# Patient Record
Sex: Male | Born: 1945 | Race: White | Hispanic: No | Marital: Married | Smoking: Former smoker
Health system: Southern US, Community
[De-identification: ages and names within clinical notes are randomized; demographics above are authoritative.]

## PROBLEM LIST (undated history)

## (undated) DIAGNOSIS — E7800 Pure hypercholesterolemia, unspecified: Secondary | ICD-10-CM

## (undated) DIAGNOSIS — C801 Malignant (primary) neoplasm, unspecified: Secondary | ICD-10-CM

## (undated) DIAGNOSIS — E039 Hypothyroidism, unspecified: Secondary | ICD-10-CM

## (undated) DIAGNOSIS — I219 Acute myocardial infarction, unspecified: Secondary | ICD-10-CM

## (undated) DIAGNOSIS — I10 Essential (primary) hypertension: Secondary | ICD-10-CM

## (undated) DIAGNOSIS — E079 Disorder of thyroid, unspecified: Secondary | ICD-10-CM

## (undated) HISTORY — PX: OTHER SURGICAL HISTORY: SHX169

## (undated) HISTORY — PX: COLONOSCOPY: SHX5424

## (undated) HISTORY — PX: CORONARY ANGIOPLASTY: SHX604

## (undated) HISTORY — PX: CATARACT EXTRACTION, BILATERAL: SHX1313

---

## 2001-04-25 ENCOUNTER — Encounter: Payer: Self-pay | Admitting: Internal Medicine

## 2001-04-25 ENCOUNTER — Ambulatory Visit (HOSPITAL_COMMUNITY): Admission: RE | Admit: 2001-04-25 | Discharge: 2001-04-25 | Payer: Self-pay | Admitting: Internal Medicine

## 2002-05-25 ENCOUNTER — Encounter: Payer: Self-pay | Admitting: Family Medicine

## 2002-05-25 ENCOUNTER — Ambulatory Visit (HOSPITAL_COMMUNITY): Admission: RE | Admit: 2002-05-25 | Discharge: 2002-05-25 | Payer: Self-pay | Admitting: Family Medicine

## 2003-03-29 ENCOUNTER — Ambulatory Visit (HOSPITAL_COMMUNITY): Admission: RE | Admit: 2003-03-29 | Discharge: 2003-03-29 | Payer: Self-pay | Admitting: Internal Medicine

## 2004-03-03 ENCOUNTER — Ambulatory Visit: Payer: Self-pay | Admitting: Cardiology

## 2004-03-06 ENCOUNTER — Encounter: Payer: Self-pay | Admitting: Emergency Medicine

## 2004-03-06 ENCOUNTER — Ambulatory Visit: Payer: Self-pay | Admitting: *Deleted

## 2004-03-06 ENCOUNTER — Inpatient Hospital Stay (HOSPITAL_COMMUNITY): Admission: EM | Admit: 2004-03-06 | Discharge: 2004-03-09 | Payer: Self-pay | Admitting: *Deleted

## 2004-03-23 ENCOUNTER — Ambulatory Visit: Payer: Self-pay

## 2004-03-27 ENCOUNTER — Ambulatory Visit: Payer: Self-pay | Admitting: Cardiology

## 2004-03-27 ENCOUNTER — Observation Stay (HOSPITAL_COMMUNITY): Admission: AD | Admit: 2004-03-27 | Discharge: 2004-03-28 | Payer: Self-pay | Admitting: Family Medicine

## 2004-03-27 ENCOUNTER — Ambulatory Visit (HOSPITAL_COMMUNITY): Admission: RE | Admit: 2004-03-27 | Discharge: 2004-03-27 | Payer: Self-pay | Admitting: Family Medicine

## 2004-06-28 ENCOUNTER — Ambulatory Visit: Payer: Self-pay | Admitting: *Deleted

## 2004-07-06 ENCOUNTER — Ambulatory Visit: Payer: Self-pay

## 2004-07-07 ENCOUNTER — Ambulatory Visit: Payer: Self-pay | Admitting: *Deleted

## 2004-07-07 ENCOUNTER — Ambulatory Visit (HOSPITAL_COMMUNITY): Admission: RE | Admit: 2004-07-07 | Discharge: 2004-07-07 | Payer: Self-pay | Admitting: *Deleted

## 2004-07-18 ENCOUNTER — Ambulatory Visit: Payer: Self-pay | Admitting: *Deleted

## 2004-07-19 ENCOUNTER — Ambulatory Visit: Payer: Self-pay | Admitting: Internal Medicine

## 2004-08-11 ENCOUNTER — Ambulatory Visit: Payer: Self-pay

## 2004-08-14 ENCOUNTER — Ambulatory Visit: Payer: Self-pay | Admitting: Internal Medicine

## 2004-08-16 ENCOUNTER — Inpatient Hospital Stay (HOSPITAL_COMMUNITY): Admission: RE | Admit: 2004-08-16 | Discharge: 2004-08-18 | Payer: Self-pay | Admitting: Internal Medicine

## 2004-08-17 ENCOUNTER — Ambulatory Visit: Payer: Self-pay | Admitting: *Deleted

## 2004-09-01 ENCOUNTER — Ambulatory Visit: Payer: Self-pay | Admitting: Internal Medicine

## 2004-09-11 ENCOUNTER — Ambulatory Visit: Payer: Self-pay | Admitting: Internal Medicine

## 2004-10-06 ENCOUNTER — Ambulatory Visit: Payer: Self-pay | Admitting: Cardiology

## 2004-10-06 ENCOUNTER — Encounter: Payer: Self-pay | Admitting: Emergency Medicine

## 2004-10-07 ENCOUNTER — Observation Stay (HOSPITAL_COMMUNITY): Admission: AD | Admit: 2004-10-07 | Discharge: 2004-10-08 | Payer: Self-pay | Admitting: Cardiology

## 2005-03-20 ENCOUNTER — Other Ambulatory Visit: Admission: RE | Admit: 2005-03-20 | Discharge: 2005-03-20 | Payer: Self-pay | Admitting: Dermatology

## 2012-02-18 ENCOUNTER — Encounter (HOSPITAL_COMMUNITY): Payer: Self-pay

## 2012-02-18 ENCOUNTER — Inpatient Hospital Stay (HOSPITAL_COMMUNITY)
Admission: EM | Admit: 2012-02-18 | Discharge: 2012-02-21 | DRG: 446 | Disposition: A | Discharge status: Other Acute Inpatient Hospital | Payer: Medicare HMO | Attending: Internal Medicine | Admitting: Internal Medicine

## 2012-02-18 ENCOUNTER — Ambulatory Visit (HOSPITAL_COMMUNITY)
Admission: RE | Admit: 2012-02-18 | Discharge: 2012-02-18 | Disposition: A | Discharge status: Home / Self Care | Payer: Medicare HMO | Source: Ambulatory Visit | Attending: Family Medicine | Admitting: Family Medicine

## 2012-02-18 ENCOUNTER — Other Ambulatory Visit (HOSPITAL_COMMUNITY): Payer: Self-pay | Admitting: Family Medicine

## 2012-02-18 ENCOUNTER — Emergency Department (HOSPITAL_COMMUNITY): Payer: Medicare HMO

## 2012-02-18 ENCOUNTER — Other Ambulatory Visit: Payer: Self-pay

## 2012-02-18 DIAGNOSIS — R932 Abnormal findings on diagnostic imaging of liver and biliary tract: Secondary | ICD-10-CM | POA: Insufficient documentation

## 2012-02-18 DIAGNOSIS — E039 Hypothyroidism, unspecified: Secondary | ICD-10-CM | POA: Diagnosis present

## 2012-02-18 DIAGNOSIS — K8051 Calculus of bile duct without cholangitis or cholecystitis with obstruction: Principal | ICD-10-CM | POA: Diagnosis present

## 2012-02-18 DIAGNOSIS — Z79899 Other long term (current) drug therapy: Secondary | ICD-10-CM

## 2012-02-18 DIAGNOSIS — Z87891 Personal history of nicotine dependence: Secondary | ICD-10-CM

## 2012-02-18 DIAGNOSIS — I4510 Unspecified right bundle-branch block: Secondary | ICD-10-CM | POA: Diagnosis present

## 2012-02-18 DIAGNOSIS — R109 Unspecified abdominal pain: Secondary | ICD-10-CM | POA: Insufficient documentation

## 2012-02-18 DIAGNOSIS — R17 Unspecified jaundice: Secondary | ICD-10-CM | POA: Insufficient documentation

## 2012-02-18 DIAGNOSIS — Z7982 Long term (current) use of aspirin: Secondary | ICD-10-CM

## 2012-02-18 DIAGNOSIS — K8020 Calculus of gallbladder without cholecystitis without obstruction: Secondary | ICD-10-CM | POA: Diagnosis present

## 2012-02-18 DIAGNOSIS — K831 Obstruction of bile duct: Secondary | ICD-10-CM | POA: Diagnosis present

## 2012-02-18 DIAGNOSIS — K8050 Calculus of bile duct without cholangitis or cholecystitis without obstruction: Secondary | ICD-10-CM

## 2012-02-18 DIAGNOSIS — E785 Hyperlipidemia, unspecified: Secondary | ICD-10-CM | POA: Diagnosis present

## 2012-02-18 DIAGNOSIS — E7800 Pure hypercholesterolemia, unspecified: Secondary | ICD-10-CM | POA: Diagnosis present

## 2012-02-18 DIAGNOSIS — Z8249 Family history of ischemic heart disease and other diseases of the circulatory system: Secondary | ICD-10-CM

## 2012-02-18 DIAGNOSIS — Z9861 Coronary angioplasty status: Secondary | ICD-10-CM

## 2012-02-18 DIAGNOSIS — I2510 Atherosclerotic heart disease of native coronary artery without angina pectoris: Secondary | ICD-10-CM

## 2012-02-18 DIAGNOSIS — I10 Essential (primary) hypertension: Secondary | ICD-10-CM | POA: Diagnosis present

## 2012-02-18 HISTORY — DX: Pure hypercholesterolemia, unspecified: E78.00

## 2012-02-18 HISTORY — DX: Disorder of thyroid, unspecified: E07.9

## 2012-02-18 HISTORY — DX: Essential (primary) hypertension: I10

## 2012-02-18 LAB — CBC WITH DIFFERENTIAL/PLATELET
Eosinophils Absolute: 0.1 10*3/uL (ref 0.0–0.7)
Eosinophils Relative: 2 % (ref 0–5)
Hemoglobin: 13.7 g/dL (ref 13.0–17.0)
Lymphs Abs: 1.7 10*3/uL (ref 0.7–4.0)
MCH: 29.6 pg (ref 26.0–34.0)
MCV: 87.3 fL (ref 78.0–100.0)
Monocytes Absolute: 0.9 10*3/uL (ref 0.1–1.0)
Monocytes Relative: 11 % (ref 3–12)
RBC: 4.63 MIL/uL (ref 4.22–5.81)

## 2012-02-18 LAB — LIPASE, BLOOD: Lipase: 14 U/L (ref 11–59)

## 2012-02-18 LAB — COMPREHENSIVE METABOLIC PANEL
Alkaline Phosphatase: 185 U/L — ABNORMAL HIGH (ref 39–117)
BUN: 13 mg/dL (ref 6–23)
Calcium: 9.3 mg/dL (ref 8.4–10.5)
GFR calc Af Amer: 89 mL/min — ABNORMAL LOW (ref >90)
Glucose, Bld: 111 mg/dL — ABNORMAL HIGH (ref 70–99)
Total Protein: 7.5 g/dL (ref 6.0–8.3)

## 2012-02-18 MED ORDER — SODIUM CHLORIDE 0.9 % IV SOLN: INTRAVENOUS | Status: DC

## 2012-02-18 MED ORDER — TRAZODONE HCL 50 MG PO TABS
100.0000 mg | ORAL_TABLET | Freq: Every day | ORAL | Status: DC
  Administered 2012-02-18 – 2012-02-20 (×3): 100 mg via ORAL
  Filled 2012-02-18 (×3): qty 2

## 2012-02-18 MED ORDER — ACETAMINOPHEN 650 MG RE SUPP: 650.0000 mg | Freq: Four times a day (QID) | RECTAL | Status: DC | PRN

## 2012-02-18 MED ORDER — PIPERACILLIN-TAZOBACTAM 3.375 G IVPB
INTRAVENOUS | Status: AC
  Filled 2012-02-18: qty 100

## 2012-02-18 MED ORDER — ONDANSETRON HCL 4 MG PO TABS: 4.0000 mg | ORAL_TABLET | Freq: Four times a day (QID) | ORAL | Status: DC | PRN

## 2012-02-18 MED ORDER — PIPERACILLIN-TAZOBACTAM 3.375 G IVPB
3.3750 g | Freq: Three times a day (TID) | INTRAVENOUS | Status: DC
  Administered 2012-02-18 – 2012-02-20 (×6): 3.375 g via INTRAVENOUS
  Filled 2012-02-18 (×9): qty 50

## 2012-02-18 MED ORDER — VERAPAMIL HCL ER 240 MG PO TBCR
240.0000 mg | EXTENDED_RELEASE_TABLET | Freq: Every morning | ORAL | Status: DC
  Administered 2012-02-20: 240 mg via ORAL
  Filled 2012-02-18: qty 1

## 2012-02-18 MED ORDER — ACETAMINOPHEN 325 MG PO TABS: 650.0000 mg | ORAL_TABLET | Freq: Four times a day (QID) | ORAL | Status: DC | PRN

## 2012-02-18 MED ORDER — SODIUM CHLORIDE 0.9 % IV SOLN
INTRAVENOUS | Status: DC
  Administered 2012-02-18 – 2012-02-19 (×2): via INTRAVENOUS

## 2012-02-18 MED ORDER — LEVOTHYROXINE SODIUM 75 MCG PO TABS
175.0000 ug | ORAL_TABLET | Freq: Every day | ORAL | Status: DC
  Administered 2012-02-19 – 2012-02-20 (×2): 175 ug via ORAL
  Filled 2012-02-18 (×2): qty 1

## 2012-02-18 MED ORDER — LISINOPRIL 10 MG PO TABS
20.0000 mg | ORAL_TABLET | Freq: Every day | ORAL | Status: DC
  Administered 2012-02-18 – 2012-02-20 (×3): 20 mg via ORAL
  Filled 2012-02-18 (×3): qty 2

## 2012-02-18 MED ORDER — MORPHINE SULFATE 2 MG/ML IJ SOLN
2.0000 mg | INTRAMUSCULAR | Status: DC | PRN
  Administered 2012-02-19 – 2012-02-20 (×3): 2 mg via INTRAVENOUS
  Filled 2012-02-18 (×3): qty 1

## 2012-02-18 MED ORDER — SODIUM CHLORIDE 0.9 % IJ SOLN
3.0000 mL | Freq: Two times a day (BID) | INTRAMUSCULAR | Status: DC
  Administered 2012-02-20: 3 mL via INTRAVENOUS
  Filled 2012-02-18: qty 3

## 2012-02-18 MED ORDER — ONDANSETRON HCL 4 MG/2ML IJ SOLN
4.0000 mg | Freq: Four times a day (QID) | INTRAMUSCULAR | Status: DC | PRN
  Administered 2012-02-19: 4 mg via INTRAVENOUS
  Filled 2012-02-18: qty 2

## 2012-02-18 NOTE — ED Notes (Signed)
Dr.mcmanus to see pt 

## 2012-02-18 NOTE — ED Notes (Signed)
Pt has been sick for 2 weeks w/ ab pain, had ultrasound today and told by pmd to come to er for eval. +nausea and diarrhea.

## 2012-02-18 NOTE — ED Notes (Signed)
Family at bedside. Patient was informed an UA is needed and patient states he can not give one at this time. Patient would like something to drink. RN made aware.

## 2012-02-18 NOTE — ED Provider Notes (Signed)
History     CSN: [id number]  Arrival date & time 02/18/12  1727   First MD Initiated Contact with Patient 02/18/12 1803      Chief Complaint  Patient presents with  . Abdominal Pain    HPI Pt was seen at 1810.  Per pt, c/o gradual onset and persistence of constant upper abd "pain" for the past 2 weeks.  Has been associated with multiple intermittent episodes of N/V/D as well as jaundice.  Pt states he was eval by his PMD for same, had labs completed last weekend and had [place] abd done today.  Pt was then called and told to come to the ED for further eval and admission.  Denies black or blood in stools or emesis, no back pain, no fevers, no CP/SOB.    Past Medical History  Diagnosis Date  . Hypertension   . Thyroid disease   . Hypercholesteremia     Past Surgical History  Procedure Date  . Cardiac stents     History  Substance Use Topics  . Smoking status: Never Smoker   . Smokeless tobacco: Not on file  . Alcohol Use: No      Review of Systems ROS: Statement: All systems negative except as marked or noted in the HPI; Constitutional: Negative for fever and chills. ; ; Eyes: Negative for eye pain, redness and discharge. ; ; ENMT: Negative for ear pain, hoarseness, nasal congestion, sinus pressure and sore throat. ; ; Cardiovascular: Negative for chest pain, palpitations, diaphoresis, dyspnea and peripheral edema. ; ; Respiratory: Negative for cough, wheezing and stridor. ; ; Gastrointestinal: +abd pain, N/V/D. Negative for blood in stool, hematemesis, jaundice and rectal bleeding. . ; ; Genitourinary: Negative for dysuria, flank pain and hematuria. ; ; Musculoskeletal: Negative for back pain and neck pain. Negative for swelling and trauma.; ; Skin: Negative for pruritus, rash, abrasions, blisters, bruising and skin lesion.; ; Neuro: Negative for headache, lightheadedness and neck stiffness. Negative for weakness, altered level of consciousness , altered mental status, extremity  weakness, paresthesias, involuntary movement, seizure and syncope.       Allergies  Review of patient's allergies indicates no known allergies.  Home Medications   Current Outpatient Rx  Name Route Sig Dispense Refill  . ASPIRIN EC 81 MG PO TBEC Oral Take 81 mg by mouth every evening.    Marland Kitchen OMEGA-3 FATTY ACIDS 1000 MG PO CAPS Oral Take 2 g by mouth 2 (two) times daily.    Marland Kitchen LEVOTHYROXINE SODIUM 175 MCG PO TABS Oral Take 175 mcg by mouth every morning.    Marland Kitchen LISINOPRIL 20 MG PO TABS Oral Take 20 mg by mouth at bedtime.    . TRAZODONE HCL 100 MG PO TABS Oral Take 100 mg by mouth at bedtime.    Marland Kitchen VERAPAMIL HCL ER 240 MG PO TBCR Oral Take 240 mg by mouth every morning.      BP 128/62  Pulse 65  Temp 97.7 F (36.5 C) (Oral)  Resp 18  Ht 6\' 2"  (1.88 m)  Wt 240 lb (108.863 kg)  BMI 30.81 kg/m2  SpO2 94%  Physical Exam 1815:  Physical examination:  Nursing notes reviewed; Vital signs and O2 SAT reviewed;  Constitutional: Well developed, Well nourished, Well hydrated, In no acute distress; Head:  Normocephalic, atraumatic; Eyes: EOMI, PERRL, +scleral icterus; ENMT: Mouth and pharynx normal, Mucous membranes moist; Neck: Supple, Full range of motion, No lymphadenopathy; Cardiovascular: Regular rate and rhythm, No gallop; Respiratory: Breath sounds clear &  equal bilaterally, No wheezes.  Speaking full sentences with ease, Normal respiratory effort/excursion; Chest: Nontender, Movement normal; Abdomen: Soft, +RUQ tender to palp. No rebound or guarding, Nondistended, Normal bowel sounds; Genitourinary: No CVA tenderness; Extremities: Pulses normal, No tenderness, No edema, No calf edema or asymmetry.; Neuro: AA&Ox3, Major CN grossly intact.  Speech clear. No gross focal motor or sensory deficits in extremities.; Skin: Color jaundiced, Warm, Dry.   ED Course  Procedures    MDM  MDM Reviewed: nursing note, vitals and previous chart Reviewed previous: ultrasound and ECG Interpretation: labs,  x-ray and ECG    Date: 02/18/2012  Rate: 68  Rhythm: normal sinus rhythm  QRS Axis: normal  Intervals: normal  ST/T Wave abnormalities: normal  Conduction Disutrbances:right bundle branch block  Narrative Interpretation:   Old EKG Reviewed: unchanged; no significant changes from previous EKG dated 10/07/2004.  Results for orders placed during the hospital encounter of 02/18/12  CBC WITH DIFFERENTIAL      Component Value Range   WBC 8.2  4.0 - 10.5 K/uL   RBC 4.63  4.22 - 5.81 MIL/uL   Hemoglobin 13.7  13.0 - 17.0 g/dL   HCT 56.2  13.0 - 86.5 %   MCV 87.3  78.0 - 100.0 fL   MCH 29.6  26.0 - 34.0 pg   MCHC 33.9  30.0 - 36.0 g/dL   RDW 78.4 (*) 69.6 - 29.5 %   Platelets 241  150 - 400 K/uL   Neutrophils Relative 65  43 - 77 %   Neutro Abs 5.3  1.7 - 7.7 K/uL   Lymphocytes Relative 21  12 - 46 %   Lymphs Abs 1.7  0.7 - 4.0 K/uL   Monocytes Relative 11  3 - 12 %   Monocytes Absolute 0.9  0.1 - 1.0 K/uL   Eosinophils Relative 2  0 - 5 %   Eosinophils Absolute 0.1  0.0 - 0.7 K/uL   Basophils Relative 1  0 - 1 %   Basophils Absolute 0.1  0.0 - 0.1 K/uL  COMPREHENSIVE METABOLIC PANEL      Component Value Range   Sodium 132 (*) 135 - 145 mEq/L   Potassium 3.5  3.5 - 5.1 mEq/L   Chloride 99  96 - 112 mEq/L   CO2 23  19 - 32 mEq/L   Glucose, Bld 111 (*) 70 - 99 mg/dL   BUN 13  6 - 23 mg/dL   Creatinine, Ser 2.84  0.50 - 1.35 mg/dL   Calcium 9.3  8.4 - 13.2 mg/dL   Total Protein 7.5  6.0 - 8.3 g/dL   Albumin 3.4 (*) 3.5 - 5.2 g/dL   AST 67 (*) 0 - 37 U/L   ALT 152 (*) 0 - 53 U/L   Alkaline Phosphatase 185 (*) 39 - 117 U/L   Total Bilirubin 9.2 (*) 0.3 - 1.2 mg/dL   GFR calc non Af Amer 76 (*) >90 mL/min   GFR calc Af Amer 89 (*) >90 mL/min  LIPASE, BLOOD      Component Value Range   Lipase 14  11 - 59 U/L  TROPONIN I      Component Value Range   Troponin I <0.30  <0.30 ng/mL   US Abdomen Complete 02/18/2012  *RADIOLOGY REPORT*  Clinical Data:  Jaundice, abdominal pain.   COMPLETE ABDOMINAL ULTRASOUND  Comparison:  03/28/2004  Findings:  Gallbladder:  Gallbladder is filled with gallstones.  The gallbladder wall is difficult to visualize due to  the extensive gallstones.  Common bile duct:   Common bile duct is dilated, measuring 10 mm. Echogenic area within the common bile duct concerning for CBD stone.  Liver:  Intrahepatic biliary ductal dilatation.  Normal echotexture.  No focal abnormality.  IVC:  Appears normal.  Pancreas:  Borderline pancreatic duct at 4 mm.  No focal parenchymal abnormality.  Spleen:  Within normal limits in size and echotexture.  Right Kidney:   Normal in size and parenchymal echogenicity.  No evidence of mass or hydronephrosis.  Left Kidney:  Normal in size and parenchymal echogenicity.  No evidence of mass or hydronephrosis.  Abdominal aorta:  No aneurysm identified.  IMPRESSION: The gallbladder is filled with gallstones.  Findings concerning for CBD stone with extrahepatic and intrahepatic biliary ductal dilatation.   Original Report Authenticated By: Cyndie Chime, M.D.      1945:  LFT's elevated.  [place] concerning for CBD stone and cholelithiasis, but no signs of acute cholecystitis.  Remains afebrile with normal VS. Dx and testing d/w pt and family.  Questions answered.  Verb understanding, agreeable to admit.  T/C to General Surgery Dr. Lovell Sheehan, case discussed, including:  HPI, pertinent PM/SHx, VS/PE, dx testing, ED course and treatment:  Agreeable to consult, requests to admit to medicine service; pt will need GI consult.  1950:  T/C to [facility] Dr. Phillips Odor, case discussed, including:  HPI, pertinent PM/SHx, VS/PE, dx testing, ED course and treatment:  Agreeable to admit.         Laray Anger, DO 02/20/12 1557

## 2012-02-18 NOTE — H&P (Addendum)
[facility] History and Physical  KONG PACKETT ZOX:[id number] DOB: Dec 16, 1945 DOA: 02/18/2012  Referring physician: EDP McMannus PCP: Kirk Ruths, MD  Specialists: Zenon Mayo, General Surgery and N. Rehman, GI  Chief Complaint: Abdominal Pain, Fever, Jaundice  HPI: Lucas Patton is a 66 y.o. male with a PMH significant for hypertension, CADs/p 2 vessel PCI, and Hyperlipidemia who presents to [facility] after a one week history of progressive abdominal pain, subjective fever and chills, and jaundice. He was seen by his PCP today who sent him for abdominal ultrasound which showed CBD stone and cholelithiasis with intra and extra hepatic ductal dilation suspicious for obstruction. He was sent to ED for admission and surgical evaluation for complicated gallstone disease and work up for CBD obstruction. Patient has been having abdominal pain intermittently for months and has largely ignored his symptoms. He was self treating by eating low fat food which reportedly did not  trigger his pain. Has had 15 lbs weight loss per wife. Today he went to see his PCP when he was noticeably jaundice, weaker, had fevers and increasing abdominal pain. He also reports orange urine for past week and light yellow mucousy stools.   Review of Systems:  Review of Systems  Constitutional: Positive for fever, chills, weight loss and malaise/fatigue.  HENT: Negative.   Eyes: Negative.   Respiratory: Negative.   Cardiovascular: Negative.   Gastrointestinal: Positive for nausea and abdominal pain.  Genitourinary: Negative.   Musculoskeletal: Negative.   Skin: Positive for itching. Negative for rash.  Neurological: Positive for weakness.  Endo/Heme/Allergies: Negative.   Psychiatric/Behavioral: Negative.   All other systems reviewed and are negative.     Past Medical History  Diagnosis Date  . Hypertension   . Thyroid disease   . Hypercholesteremia    Past Surgical History  Procedure Date  . Cardiac  stents    Social History: Smoking history positive, quit in 2005 after heart problem. 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 Lives at home with his wife.  No Known Allergies  Family History: Positive for CAD, no known hepatobiliary disease in first degree family members.  Prior to Admission medications   Medication Sig Start Date End Date Taking? Authorizing Provider  aspirin EC 81 MG tablet Take 81 mg by mouth every evening.   Yes Historical Provider, MD  fish oil-omega-3 fatty acids 1000 MG capsule Take 2 g by mouth 2 (two) times daily.   Yes Historical Provider, MD  levothyroxine (SYNTHROID, LEVOTHROID) 175 MCG tablet Take 175 mcg by mouth every morning.   Yes Historical Provider, MD  lisinopril (PRINIVIL,ZESTRIL) 20 MG tablet Take 20 mg by mouth at bedtime.   Yes Historical Provider, MD  traZODone (DESYREL) 100 MG tablet Take 100 mg by mouth at bedtime.   Yes Historical Provider, MD  verapamil (CALAN-SR) 240 MG CR tablet Take 240 mg by mouth every morning.   Yes Historical Provider, MD   Physical Exam: Filed Vitals:   02/18/12 1735 02/18/12 1800 02/18/12 1900  BP: 112/59 113/54 128/62  Pulse: 66 65   Temp: 97.7 F (36.5 C)    TempSrc: Oral    Resp: 18 20 18   Height: 6\' 2"  (1.88 m)    Weight: 108.863 kg (240 lb)    SpO2: 94% 94%      General:  Alert, NAD, jaundice but not toxic appearing gentleman, pleasant and cooperative  Eyes: icteric, PERRL  ENT: normal  Neck: supple  Cardiovascular: RRR, no mrg  Respiratory: fixed crackles in bases not cleared with cough, otherwise normal breath sounds and air movement  Abdomen: RUQ tenderness, slightly distended, no other masses palpable  Skin: Jaundice, no edema or ascites, solar exposure skin damage, nails are dry and brittle  Musculoskeletal: normal  Psychiatric: appropriate  Neurologic: non-focal  Labs on Admission:  Basic  Metabolic Panel:  Lab 02/18/12 1610  NA 132*  K 3.5  CL 99  CO2 23  GLUCOSE 111*  BUN 13  CREATININE 1.00  CALCIUM 9.3  MG --  PHOS --   Liver Function Tests:  Lab 02/18/12 1800  AST 67*  ALT 152*  ALKPHOS 185*  BILITOT 9.2*  PROT 7.5  ALBUMIN 3.4*    Lab 02/18/12 1800  LIPASE 14  AMYLASE --   CBC:  Lab 02/18/12 1800  WBC 8.2  NEUTROABS 5.3  HGB 13.7  HCT 40.4  MCV 87.3  PLT 241   Cardiac Enzymes:  Lab 02/18/12 1818  CKTOTAL --  CKMB --  CKMBINDEX --  TROPONINI <0.30     Radiological Exams on Admission: US Abdomen Complete  02/18/2012  *RADIOLOGY REPORT*  Clinical Data:  Jaundice, abdominal pain.  COMPLETE ABDOMINAL ULTRASOUND  Comparison:  03/28/2004  Findings:  Gallbladder:  Gallbladder is filled with gallstones.  The gallbladder wall is difficult to visualize due to the extensive gallstones.  Common bile duct:   Common bile duct is dilated, measuring 10 mm. Echogenic area within the common bile duct concerning for CBD stone.  Liver:  Intrahepatic biliary ductal dilatation.  Normal echotexture.  No focal abnormality.  IVC:  Appears normal.  Pancreas:  Borderline pancreatic duct at 4 mm.  No focal parenchymal abnormality.  Spleen:  Within normal limits in size and echotexture.  Right Kidney:   Normal in size and parenchymal echogenicity.  No evidence of mass or hydronephrosis.  Left Kidney:  Normal in size and parenchymal echogenicity.  No evidence of mass or hydronephrosis.  Abdominal aorta:  No aneurysm identified.  IMPRESSION: The gallbladder is filled with gallstones.  Findings concerning for CBD stone with extrahepatic and intrahepatic biliary ductal dilatation.   Original Report Authenticated By: Cyndie Chime, M.D.     EKG: Independently reviewed. New Right Bundle Branch Block compared to old 2006 EKG, unclear significance.  Assessment/Plan 1. Choledocholithiasis, probable CBD obstruction, high risk patient for progression to cholangitis, he has had  fevers and chills at home but none measured in ED-but on clinical exam he feels very hot. [facility] present. LFTs support obstruction. Lipase is not elevated. US shows CBD stone and ductal dilation.  Will obtain GI evaluation and probably will need to proceed to ERCP with stone removal, followed by elective cholecystectomy, surgery consulted in ED (Dr. Lovell Sheehan).  Will keep NPO after midnight and clears until then.  Since he has had fever and pain will start empiric Zosyn for cholangitis until ERCP/MRCP evaluation.  ASA and DVT prophylaxis held pending surgery.  2. Hypertension stable controlled on Verapamil and Lisinopril, maintain these meds as inpatient.  3. CAD, stable no chest pain or cardiac issues since cath and stents in 2006. Has not had regular cardiology follow-up since that time. He is on ASA. LVEF was normal post cath in 2006.   4. New RBBB on EKG, likely insignificant but encouraged cardiology evaluation following hospitalization. Will monitor on tele for 24 hours while inpatient. CE negative.  5. Thyroid Disease  on Synthroid, will check TSH.  6. HLD, unclear subtype, will check FLP in AM.   Code Status: Full Code Family Communication: Plan of care discussed in detail with patient and his wife at bedside. Disposition Plan: Inpatient status, anticipate >2 nights with need for GI eval and surgical intervention. Will d/c home when medically stable.  Time spent: 70 minutes  [facility] Pager [phone]  If 7PM-7AM, please contact night-coverage [email or web address] Password [email or web address] 02/18/2012, 8:22 PM

## 2012-02-18 NOTE — ED Notes (Signed)
Pt reports being sick for 2 weeks, having ab pain, w/ nausea and diarrhea. Has multiple tests and ultrasound today, was called by pmd this afternoon and told to come to er to be admitted. That he had "a large gallstone", pt jaundiced at present. Denies pain at arrival but stated it comes and goes.

## 2012-02-18 NOTE — ED Notes (Signed)
Family at bedside. RN at bedside. 

## 2012-02-18 NOTE — ED Notes (Signed)
Pt resting in bed awaiting surgical consult, no requests at this time.

## 2012-02-19 ENCOUNTER — Encounter (HOSPITAL_COMMUNITY): Payer: Self-pay | Admitting: *Deleted

## 2012-02-19 ENCOUNTER — Observation Stay (HOSPITAL_COMMUNITY): Payer: Medicare HMO

## 2012-02-19 ENCOUNTER — Encounter (HOSPITAL_COMMUNITY): Payer: Self-pay | Admitting: Anesthesiology

## 2012-02-19 ENCOUNTER — Observation Stay (HOSPITAL_COMMUNITY): Payer: Medicare HMO | Admitting: Anesthesiology

## 2012-02-19 ENCOUNTER — Encounter (HOSPITAL_COMMUNITY)
Admission: EM | Disposition: A | Discharge status: Other Acute Inpatient Hospital | Payer: Self-pay | Source: Home / Self Care | Attending: Internal Medicine

## 2012-02-19 DIAGNOSIS — R17 Unspecified jaundice: Secondary | ICD-10-CM

## 2012-02-19 DIAGNOSIS — I10 Essential (primary) hypertension: Secondary | ICD-10-CM

## 2012-02-19 DIAGNOSIS — K8309 Other cholangitis: Secondary | ICD-10-CM

## 2012-02-19 DIAGNOSIS — R7989 Other specified abnormal findings of blood chemistry: Secondary | ICD-10-CM

## 2012-02-19 DIAGNOSIS — K8051 Calculus of bile duct without cholangitis or cholecystitis with obstruction: Principal | ICD-10-CM

## 2012-02-19 DIAGNOSIS — K838 Other specified diseases of biliary tract: Secondary | ICD-10-CM

## 2012-02-19 DIAGNOSIS — K831 Obstruction of bile duct: Secondary | ICD-10-CM

## 2012-02-19 HISTORY — PX: BILIARY STENT PLACEMENT: SHX5538

## 2012-02-19 HISTORY — PX: SPHINCTEROTOMY: SHX5544

## 2012-02-19 HISTORY — PX: ERCP: SHX5425

## 2012-02-19 LAB — PROTIME-INR
INR: 0.98 (ref 0.00–1.49)
Prothrombin Time: 12.9 seconds (ref 11.6–15.2)

## 2012-02-19 LAB — CBC
Hemoglobin: 13.2 g/dL (ref 13.0–17.0)
MCH: 29.3 pg (ref 26.0–34.0)
RBC: 4.51 MIL/uL (ref 4.22–5.81)

## 2012-02-19 LAB — COMPREHENSIVE METABOLIC PANEL
AST: 61 U/L — ABNORMAL HIGH (ref 0–37)
CO2: 25 mEq/L (ref 19–32)
Calcium: 9.1 mg/dL (ref 8.4–10.5)
Creatinine, Ser: 0.88 mg/dL (ref 0.50–1.35)
GFR calc Af Amer: >90 mL/min (ref >90)
GFR calc non Af Amer: 88 mL/min — ABNORMAL LOW (ref >90)

## 2012-02-19 LAB — TSH: TSH: 1.836 u[IU]/mL (ref 0.350–4.500)

## 2012-02-19 LAB — GLUCOSE, CAPILLARY: Glucose-Capillary: 133 mg/dL — ABNORMAL HIGH (ref 70–99)

## 2012-02-19 LAB — URINALYSIS, ROUTINE W REFLEX MICROSCOPIC
Nitrite: NEGATIVE
Specific Gravity, Urine: 1.02 (ref 1.005–1.030)
Urobilinogen, UA: 0.2 mg/dL (ref 0.0–1.0)

## 2012-02-19 LAB — SURGICAL PCR SCREEN: Staphylococcus aureus: NEGATIVE

## 2012-02-19 LAB — LIPID PANEL
LDL Cholesterol: 159 mg/dL — ABNORMAL HIGH (ref 0–99)
Triglycerides: 220 mg/dL — ABNORMAL HIGH (ref <150)
VLDL: 44 mg/dL — ABNORMAL HIGH (ref 0–40)

## 2012-02-19 SURGERY — ERCP, WITH INTERVENTION IF INDICATED
Anesthesia: General

## 2012-02-19 MED ORDER — LACTATED RINGERS IV SOLN
INTRAVENOUS | Status: DC
  Administered 2012-02-19: 13:00:00 via INTRAVENOUS

## 2012-02-19 MED ORDER — SODIUM CHLORIDE 0.9 % IV SOLN
INTRAVENOUS | Status: DC | PRN
  Administered 2012-02-19: 13:00:00

## 2012-02-19 MED ORDER — GLUCAGON HCL (RDNA) 1 MG IJ SOLR
INTRAMUSCULAR | Status: DC | PRN
  Administered 2012-02-19 (×4): 0.25 mg via INTRAVENOUS

## 2012-02-19 MED ORDER — MIDAZOLAM HCL 2 MG/2ML IJ SOLN
1.0000 mg | INTRAMUSCULAR | Status: DC | PRN
  Administered 2012-02-19: 2 mg via INTRAVENOUS

## 2012-02-19 MED ORDER — SUCCINYLCHOLINE CHLORIDE 20 MG/ML IJ SOLN
INTRAMUSCULAR | Status: AC
  Filled 2012-02-19: qty 1

## 2012-02-19 MED ORDER — GLYCOPYRROLATE 0.2 MG/ML IJ SOLN
0.2000 mg | Freq: Once | INTRAMUSCULAR | Status: AC
  Administered 2012-02-19: 0.2 mg via INTRAVENOUS

## 2012-02-19 MED ORDER — MIDAZOLAM HCL 2 MG/2ML IJ SOLN
INTRAMUSCULAR | Status: AC
  Filled 2012-02-19: qty 2

## 2012-02-19 MED ORDER — ALUM & MAG HYDROXIDE-SIMETH 200-200-20 MG/5ML PO SUSP
30.0000 mL | Freq: Once | ORAL | Status: AC
  Administered 2012-02-19: 30 mL via ORAL

## 2012-02-19 MED ORDER — PROPOFOL 10 MG/ML IV BOLUS
INTRAVENOUS | Status: DC | PRN
  Administered 2012-02-19: 180 mg via INTRAVENOUS

## 2012-02-19 MED ORDER — ROCURONIUM BROMIDE 50 MG/5ML IV SOLN
INTRAVENOUS | Status: AC
  Filled 2012-02-19: qty 1

## 2012-02-19 MED ORDER — ENOXAPARIN SODIUM 40 MG/0.4ML ~~LOC~~ SOLN: 40.0000 mg | Freq: Once | SUBCUTANEOUS | Status: DC

## 2012-02-19 MED ORDER — FENTANYL CITRATE 0.05 MG/ML IJ SOLN: 25.0000 ug | INTRAMUSCULAR | Status: DC | PRN

## 2012-02-19 MED ORDER — STERILE WATER FOR IRRIGATION IR SOLN
Status: DC | PRN
  Administered 2012-02-19: 13:00:00

## 2012-02-19 MED ORDER — ONDANSETRON HCL 4 MG/2ML IJ SOLN
INTRAMUSCULAR | Status: AC
  Filled 2012-02-19: qty 2

## 2012-02-19 MED ORDER — FENTANYL CITRATE 0.05 MG/ML IJ SOLN
INTRAMUSCULAR | Status: AC
  Filled 2012-02-19: qty 2

## 2012-02-19 MED ORDER — PROPOFOL 10 MG/ML IV EMUL
INTRAVENOUS | Status: AC
  Filled 2012-02-19: qty 20

## 2012-02-19 MED ORDER — ONDANSETRON HCL 4 MG/2ML IJ SOLN
4.0000 mg | Freq: Once | INTRAMUSCULAR | Status: AC
  Administered 2012-02-19: 4 mg via INTRAVENOUS

## 2012-02-19 MED ORDER — LIDOCAINE HCL (PF) 1 % IJ SOLN
INTRAMUSCULAR | Status: AC
  Filled 2012-02-19: qty 5

## 2012-02-19 MED ORDER — SODIUM CHLORIDE 0.9 % IV SOLN: INTRAVENOUS | Status: DC

## 2012-02-19 MED ORDER — GLYCOPYRROLATE 0.2 MG/ML IJ SOLN
INTRAMUSCULAR | Status: AC
  Filled 2012-02-19: qty 1

## 2012-02-19 MED ORDER — FENTANYL CITRATE 0.05 MG/ML IJ SOLN
INTRAMUSCULAR | Status: DC | PRN
  Administered 2012-02-19: 25 ug via INTRAVENOUS
  Administered 2012-02-19: 50 ug via INTRAVENOUS
  Administered 2012-02-19: 25 ug via INTRAVENOUS

## 2012-02-19 MED ORDER — NEOSTIGMINE METHYLSULFATE 1 MG/ML IJ SOLN
INTRAMUSCULAR | Status: DC | PRN
  Administered 2012-02-19: 2 mg via INTRAVENOUS

## 2012-02-19 MED ORDER — ROCURONIUM BROMIDE 100 MG/10ML IV SOLN
INTRAVENOUS | Status: DC | PRN
  Administered 2012-02-19: 10 mg via INTRAVENOUS
  Administered 2012-02-19: 15 mg via INTRAVENOUS

## 2012-02-19 MED ORDER — ONDANSETRON HCL 4 MG/2ML IJ SOLN: 4.0000 mg | Freq: Once | INTRAMUSCULAR | Status: DC | PRN

## 2012-02-19 MED ORDER — SUCCINYLCHOLINE CHLORIDE 20 MG/ML IJ SOLN
INTRAMUSCULAR | Status: DC | PRN
  Administered 2012-02-19: 180 mg via INTRAVENOUS

## 2012-02-19 MED ORDER — MIDAZOLAM HCL 5 MG/5ML IJ SOLN
INTRAMUSCULAR | Status: DC | PRN
  Administered 2012-02-19: 2 mg via INTRAVENOUS

## 2012-02-19 SURGICAL SUPPLY — 28 items
BAG HAMPER (MISCELLANEOUS) ×2 IMPLANT
BALLN RETRIEVAL 12X15 (BALLOONS) IMPLANT
BALN RTRVL 200 6-7FR 12-15 (BALLOONS)
BASKET TRAPEZOID 3X6 (MISCELLANEOUS) IMPLANT
BSKT STON RTRVL TRAPEZOID 3X6 (MISCELLANEOUS)
DEVICE INFLATION ENCORE 26 (MISCELLANEOUS) IMPLANT
DEVICE LOCKING W-BIOPSY CAP (MISCELLANEOUS) ×2 IMPLANT
EXTRACTOR PRO RX ×1 IMPLANT
GUIDEWIRE HYDRA JAGWIRE .35 (WIRE) IMPLANT
GUIDEWIRE JAG HINI 025X260CM (WIRE) IMPLANT
KIT ROOM TURNOVER APOR (KITS) ×2 IMPLANT
LUBRICANT JELLY 4.5OZ STERILE (MISCELLANEOUS) ×1 IMPLANT
NDL HYPO 18GX1.5 BLUNT FILL (NEEDLE) IMPLANT
NEEDLE HYPO 18GX1.5 BLUNT FILL (NEEDLE) IMPLANT
PAD ARMBOARD 7.5X6 YLW CONV (MISCELLANEOUS) ×2 IMPLANT
PATHFINDER 450CM 0.18 (STENTS) IMPLANT
POSITIONER HEAD 8X9X4 ADT (SOFTGOODS) IMPLANT
SNARE ROTATE MED OVAL 20MM (MISCELLANEOUS) IMPLANT
SPHINCTEROTOME AUTOTOME .25 (MISCELLANEOUS) IMPLANT
SPHINCTEROTOME HYDRATOME 44 (MISCELLANEOUS) ×1 IMPLANT
SPONGE GAUZE 4X4 12PLY (GAUZE/BANDAGES/DRESSINGS) ×2 IMPLANT
SYR 3ML LL SCALE MARK (SYRINGE) ×1 IMPLANT
SYR 50ML LL SCALE MARK (SYRINGE) ×2 IMPLANT
SYSTEM CONTINUOUS INJECTION (MISCELLANEOUS) ×2 IMPLANT
TUBING ENDO SMARTCAP PENTAX (MISCELLANEOUS) ×2 IMPLANT
WALLSTENT METAL COVERED 10X60 (STENTS) IMPLANT
WALLSTENT METAL COVERED 10X80 (STENTS) IMPLANT
WATER STERILE IRR 1000ML POUR (IV SOLUTION) ×3 IMPLANT

## 2012-02-19 NOTE — Transfer of Care (Signed)
Immediate Anesthesia Transfer of Care Note  Patient: Lucas Patton  Procedure(s) Performed: Procedure(s) (LRB) with comments: ENDOSCOPIC RETROGRADE CHOLANGIOPANCREATOGRAPHY (ERCP) (N/A) SPHINCTEROTOMY (N/A) BILIARY STENT PLACEMENT (N/A)  Patient Location: PACU  Anesthesia Type:General  Level of Consciousness: awake and patient cooperative  Airway & Oxygen Therapy: Patient Spontanous Breathing and Patient connected to face mask oxygen  Post-op Assessment: Report given to PACU RN, Post -op Vital signs reviewed and stable and Patient moving all extremities  Post vital signs: Reviewed and stable  Complications: No apparent anesthesia complications

## 2012-02-19 NOTE — Consult Note (Signed)
Reason for Consult: Cholelithiasis Referring Physician: [facility], Dr. Karleen Hampshire  Lucas Patton is an 66 y.o. male.  HPI: Patient is a 66 year old white male who presented emergency room with an ultrasound which showed cholelithiasis with choledocholithiasis. On further examination, the patient was noted be jaundice. Total bilirubin was noted be approximately 9. He was admitted to the hospital by the [facility]. He states I saw the patient's many years ago and recommended a cholecystectomy at that time, though he was asymptomatic.  Past Medical History  Diagnosis Date  . Hypertension   . Thyroid disease   . Hypercholesteremia     Past Surgical History  Procedure Date  . Cardiac stents     History reviewed. No pertinent family history.  Social History:  reports that he has never smoked. He does not have any smokeless tobacco history on file. He reports that he does not drink alcohol or use illicit drugs.  Allergies: No Known Allergies  Medications: I have reviewed the patient's current medications.  Results for orders placed during the hospital encounter of 02/18/12 (from the past 48 hour(s))  CBC WITH DIFFERENTIAL     Status: Abnormal   Collection Time   02/18/12  6:00 PM      Component Value Range Comment   WBC 8.2  4.0 - 10.5 K/uL    RBC 4.63  4.22 - 5.81 MIL/uL    Hemoglobin 13.7  13.0 - 17.0 g/dL    HCT 16.1  09.6 - 04.5 %    MCV 87.3  78.0 - 100.0 fL    MCH 29.6  26.0 - 34.0 pg    MCHC 33.9  30.0 - 36.0 g/dL    RDW 40.9 (*) 81.1 - 15.5 %    Platelets 241  150 - 400 K/uL    Neutrophils Relative 65  43 - 77 %    Neutro Abs 5.3  1.7 - 7.7 K/uL    Lymphocytes Relative 21  12 - 46 %    Lymphs Abs 1.7  0.7 - 4.0 K/uL    Monocytes Relative 11  3 - 12 %    Monocytes Absolute 0.9  0.1 - 1.0 K/uL    Eosinophils Relative 2  0 - 5 %    Eosinophils Absolute 0.1  0.0 - 0.7 K/uL    Basophils Relative 1  0 - 1 %    Basophils Absolute 0.1  0.0 - 0.1 K/uL     COMPREHENSIVE METABOLIC PANEL     Status: Abnormal   Collection Time   02/18/12  6:00 PM      Component Value Range Comment   Sodium 132 (*) 135 - 145 mEq/L    Potassium 3.5  3.5 - 5.1 mEq/L    Chloride 99  96 - 112 mEq/L    CO2 23  19 - 32 mEq/L    Glucose, Bld 111 (*) 70 - 99 mg/dL    BUN 13  6 - 23 mg/dL    Creatinine, Ser 9.14  0.50 - 1.35 mg/dL    Calcium 9.3  8.4 - 78.2 mg/dL    Total Protein 7.5  6.0 - 8.3 g/dL    Albumin 3.4 (*) 3.5 - 5.2 g/dL    AST 67 (*) 0 - 37 U/L    ALT 152 (*) 0 - 53 U/L    Alkaline Phosphatase 185 (*) 39 - 117 U/L    Total Bilirubin 9.2 (*) 0.3 - 1.2 mg/dL    GFR calc  non Af Amer 76 (*) >90 mL/min    GFR calc Af Amer 89 (*) >90 mL/min   LIPASE, BLOOD     Status: Normal   Collection Time   02/18/12  6:00 PM      Component Value Range Comment   Lipase 14  11 - 59 U/L   TROPONIN I     Status: Normal   Collection Time   02/18/12  6:18 PM      Component Value Range Comment   Troponin I <0.30  <0.30 ng/mL   URINALYSIS, ROUTINE W REFLEX MICROSCOPIC     Status: Abnormal   Collection Time   02/19/12 12:02 AM      Component Value Range Comment   Color, Urine BROWN (*) YELLOW BIOCHEMICALS MAY BE AFFECTED BY COLOR   APPearance CLEAR  CLEAR    Specific Gravity, Urine 1.020  1.005 - 1.030    pH 5.5  5.0 - 8.0    Glucose, UA 100 (*) NEGATIVE mg/dL    Hgb urine dipstick NEGATIVE  NEGATIVE    Bilirubin Urine LARGE (*) NEGATIVE    Ketones, ur NEGATIVE  NEGATIVE mg/dL    Protein, ur NEGATIVE  NEGATIVE mg/dL    Urobilinogen, UA 0.2  0.0 - 1.0 mg/dL    Nitrite NEGATIVE  NEGATIVE    Leukocytes, UA NEGATIVE  NEGATIVE MICROSCOPIC NOT DONE ON URINES WITH NEGATIVE PROTEIN, BLOOD, LEUKOCYTES, NITRITE, OR GLUCOSE <1000 mg/dL.  LIPID PANEL     Status: Abnormal   Collection Time   02/19/12  5:01 AM      Component Value Range Comment   Cholesterol 209 (*) 0 - 200 mg/dL    Triglycerides 161 (*) <150 mg/dL    HDL 6 (*) >09 mg/dL    Total CHOL/HDL Ratio 34.8       VLDL 44 (*) 0 - 40 mg/dL    LDL Cholesterol 604 (*) 0 - 99 mg/dL   COMPREHENSIVE METABOLIC PANEL     Status: Abnormal   Collection Time   02/19/12  5:01 AM      Component Value Range Comment   Sodium 137  135 - 145 mEq/L    Potassium 3.5  3.5 - 5.1 mEq/L    Chloride 102  96 - 112 mEq/L    CO2 25  19 - 32 mEq/L    Glucose, Bld 105 (*) 70 - 99 mg/dL    BUN 10  6 - 23 mg/dL    Creatinine, Ser 5.40  0.50 - 1.35 mg/dL    Calcium 9.1  8.4 - 98.1 mg/dL    Total Protein 6.6  6.0 - 8.3 g/dL    Albumin 2.9 (*) 3.5 - 5.2 g/dL    AST 61 (*) 0 - 37 U/L    ALT 127 (*) 0 - 53 U/L    Alkaline Phosphatase 170 (*) 39 - 117 U/L    Total Bilirubin 9.5 (*) 0.3 - 1.2 mg/dL    GFR calc non Af Amer 88 (*) >90 mL/min    GFR calc Af Amer >90  >90 mL/min   CBC     Status: Abnormal   Collection Time   02/19/12  5:01 AM      Component Value Range Comment   WBC 6.3  4.0 - 10.5 K/uL    RBC 4.51  4.22 - 5.81 MIL/uL    Hemoglobin 13.2  13.0 - 17.0 g/dL    HCT 19.1  47.8 - 29.5 %    MCV  87.1  78.0 - 100.0 fL    MCH 29.3  26.0 - 34.0 pg    MCHC 33.6  30.0 - 36.0 g/dL    RDW 16.1 (*) 09.6 - 15.5 %    Platelets 213  150 - 400 K/uL   PROTIME-INR     Status: Normal   Collection Time   02/19/12  5:01 AM      Component Value Range Comment   Prothrombin Time 12.9  11.6 - 15.2 seconds    INR 0.98  0.00 - 1.49   GLUCOSE, CAPILLARY     Status: Abnormal   Collection Time   02/19/12  7:25 AM      Component Value Range Comment   Glucose-Capillary 133 (*) 70 - 99 mg/dL     Dg Chest 2 View  04/54/0981  *RADIOLOGY REPORT*  Clinical Data: Pre operative respiratory exam for cholelithiasis and biliary obstruction.  CHEST - 2 VIEW  Comparison: 10/06/2004  Findings: Stable chronic lung disease and bibasilar scarring. There are also likely bilateral small calcified granulomata which appear stable.  No pulmonary infiltrate, edema or pleural fluid identified.  The heart size is stable and within normal limits. The bony  thorax is unremarkable.  IMPRESSION: Stable chronic lung disease and evidence of prior granulomatous disease.  No acute process identified.   Original Report Authenticated By: Reola Calkins, M.D.    US Abdomen Complete  02/18/2012  *RADIOLOGY REPORT*  Clinical Data:  Jaundice, abdominal pain.  COMPLETE ABDOMINAL ULTRASOUND  Comparison:  03/28/2004  Findings:  Gallbladder:  Gallbladder is filled with gallstones.  The gallbladder wall is difficult to visualize due to the extensive gallstones.  Common bile duct:   Common bile duct is dilated, measuring 10 mm. Echogenic area within the common bile duct concerning for CBD stone.  Liver:  Intrahepatic biliary ductal dilatation.  Normal echotexture.  No focal abnormality.  IVC:  Appears normal.  Pancreas:  Borderline pancreatic duct at 4 mm.  No focal parenchymal abnormality.  Spleen:  Within normal limits in size and echotexture.  Right Kidney:   Normal in size and parenchymal echogenicity.  No evidence of mass or hydronephrosis.  Left Kidney:  Normal in size and parenchymal echogenicity.  No evidence of mass or hydronephrosis.  Abdominal aorta:  No aneurysm identified.  IMPRESSION: The gallbladder is filled with gallstones.  Findings concerning for CBD stone with extrahepatic and intrahepatic biliary ductal dilatation.   Original Report Authenticated By: Cyndie Chime, M.D.     ROS: See chart Blood pressure 139/86, pulse 75, temperature 98.4 F (36.9 C), temperature source Oral, resp. rate 20, height 6\' 2"  (1.88 m), weight 104.6 kg (230 lb 9.6 oz), SpO2 93.00%. Physical Exam: Well-developed well-nourished white male in no acute distress. He is obviously jaundiced. Abdomen is soft, and nontender, nondistended. No hepatosplenomegaly, masses, or hernias are identified.  Assessment/Plan: Impression: Cholelithiasis, choledocholithiasis Plan: The patient is to undergo ERCP today by Dr. Karilyn Cota. I have scheduled him for laparoscopic cholecystectomy for  tomorrow. The risks and benefits of the procedure including bleeding, infection, hepatobiliary injury, the possibility of an open procedure were fully explained to the patient, who gave informed consent.  Amreen Raczkowski A 02/19/2012, 11:29 AM

## 2012-02-19 NOTE — Progress Notes (Signed)
Pt placed in w/c. Transported to MRI per xray staff.

## 2012-02-19 NOTE — Progress Notes (Signed)
Awake. Refuses po fluids. Denies pain. Lucas Patton, from MRI, would like to do MRI today. Pt in agreement.

## 2012-02-19 NOTE — Care Management Note (Signed)
    Page 1 of 1   02/20/2012     4:29:20 PM   CARE MANAGEMENT NOTE 02/20/2012  Patient:  Lucas Patton, Lucas Patton   Account Number:  [id number]  Date Initiated:  02/19/2012  Documentation initiated by:  Rosemary Holms  Subjective/Objective Assessment:   Pt admitted with gallstones. Lives with wife at home. No HH needs identified or anticipated.     Action/Plan:   Anticipated DC Date:  02/20/2012   Anticipated DC Plan:  ACUTE TO ACUTE TRANS      DC Planning Services  CM consult      Choice offered to / List presented to:             Status of service:  Completed, signed off Medicare Important Message given?   (If response is "NO", the following Medicare IM given date fields will be blank) Date Medicare IM given:   Date Additional Medicare IM given:    Discharge Disposition:  ACUTE TO ACUTE TRANS  Per UR Regulation:    If discussed at Long Length of Stay Meetings, dates discussed:    Comments:  02/19/12 1015 Mical Brun Leanord Hawking RN BSN CM

## 2012-02-19 NOTE — Anesthesia Preprocedure Evaluation (Addendum)
Anesthesia Evaluation  Patient identified by MRN, date of birth, ID band Patient awake    Reviewed: Allergy & Precautions, H&P , NPO status , Patient's Chart, lab work & pertinent test results  Airway Mallampati: I      Dental  (+) Teeth Intact   Pulmonary neg pulmonary ROS,  breath sounds clear to auscultation        Cardiovascular hypertension, Pt. on medications - angina+ CAD, + Past MI and + Cardiac Stents Rhythm:Regular Rate:Normal     Neuro/Psych    GI/Hepatic   Endo/Other  Hypothyroidism   Renal/GU      Musculoskeletal   Abdominal   Peds  Hematology   Anesthesia Other Findings   Reproductive/Obstetrics                          Anesthesia Physical Anesthesia Plan  ASA: III  Anesthesia Plan: General   Post-op Pain Management:    Induction: Intravenous  Airway Management Planned: Oral ETT  Additional Equipment:   Intra-op Plan:   Post-operative Plan: Extubation in OR  Informed Consent: I have reviewed the patients History and Physical, chart, labs and discussed the procedure including the risks, benefits and alternatives for the proposed anesthesia with the patient or authorized representative who has indicated his/her understanding and acceptance.     Plan Discussed with:   Anesthesia Plan Comments:         Anesthesia Quick Evaluation

## 2012-02-19 NOTE — Progress Notes (Signed)
UR Chart Review Completed  

## 2012-02-19 NOTE — Progress Notes (Signed)
Due to findings on ERCP, surgery has been cancelled for tomorrow.

## 2012-02-19 NOTE — Progress Notes (Signed)
Subjective: This man feels reasonably well. He does have abdominal pain, more so located in the right upper quadrant today. He has not had a fever. He is due to have ERCP today by Dr. Karilyn Cota.           Physical Exam: Blood pressure 139/86, pulse 75, temperature 98.4 F (36.9 C), temperature source Oral, resp. rate 20, height 6\' 2"  (1.88 m), weight 104.6 kg (230 lb 9.6 oz), SpO2 93.00%. He looks systemically well. Is not toxic or septic. Heart sounds are present and normal without murmurs. Lung fields are clear. Abdomen is soft with some right upper quadrant tenderness. He is alert and orientated.   Investigations:  No results found for this or any previous visit (from the past 240 hour(s)).   Basic Metabolic Panel:  Basename 02/19/12 0501 02/18/12 1800  NA 137 132*  K 3.5 3.5  CL 102 99  CO2 25 23  GLUCOSE 105* 111*  BUN 10 13  CREATININE 0.88 1.00  CALCIUM 9.1 9.3  MG -- --  PHOS -- --   Liver Function Tests:  [facility] 02/19/12 0501 02/18/12 1800  AST 61* 67*  ALT 127* 152*  ALKPHOS 170* 185*  BILITOT 9.5* 9.2*  PROT 6.6 7.5  ALBUMIN 2.9* 3.4*     CBC:  Basename 02/19/12 0501 02/18/12 1800  WBC 6.3 8.2  NEUTROABS -- 5.3  HGB 13.2 13.7  HCT 39.3 40.4  MCV 87.1 87.3  PLT 213 241    Dg Chest 2 View  02/18/2012  *RADIOLOGY REPORT*  Clinical Data: Pre operative respiratory exam for cholelithiasis and biliary obstruction.  CHEST - 2 VIEW  Comparison: 10/06/2004  Findings: Stable chronic lung disease and bibasilar scarring. There are also likely bilateral small calcified granulomata which appear stable.  No pulmonary infiltrate, edema or pleural fluid identified.  The heart size is stable and within normal limits. The bony thorax is unremarkable.  IMPRESSION: Stable chronic lung disease and evidence of prior granulomatous disease.  No acute process identified.   Original Report Authenticated By: Reola Calkins, M.D.    US Abdomen  Complete  02/18/2012  *RADIOLOGY REPORT*  Clinical Data:  Jaundice, abdominal pain.  COMPLETE ABDOMINAL ULTRASOUND  Comparison:  03/28/2004  Findings:  Gallbladder:  Gallbladder is filled with gallstones.  The gallbladder wall is difficult to visualize due to the extensive gallstones.  Common bile duct:   Common bile duct is dilated, measuring 10 mm. Echogenic area within the common bile duct concerning for CBD stone.  Liver:  Intrahepatic biliary ductal dilatation.  Normal echotexture.  No focal abnormality.  IVC:  Appears normal.  Pancreas:  Borderline pancreatic duct at 4 mm.  No focal parenchymal abnormality.  Spleen:  Within normal limits in size and echotexture.  Right Kidney:   Normal in size and parenchymal echogenicity.  No evidence of mass or hydronephrosis.  Left Kidney:  Normal in size and parenchymal echogenicity.  No evidence of mass or hydronephrosis.  Abdominal aorta:  No aneurysm identified.  IMPRESSION: The gallbladder is filled with gallstones.  Findings concerning for CBD stone with extrahepatic and intrahepatic biliary ductal dilatation.   Original Report Authenticated By: Cyndie Chime, M.D.       Medications: I have reviewed the patient's current medications.  Impression: 1. Choledocholithiasis with common bile duct obstruction secondary to gallstone. No clinical evidence of cholangitis/cholecystitis. 2. Hypertension, controlled. 3. Coronary artery disease, stable.     Plan: 1. For  endoscopic procedure today, likely  ERCP. 2. Surgery has also been consulted.     LOS: 1 day   Wilson Singer Pager [phone]  02/19/2012, 9:26 AM

## 2012-02-19 NOTE — Progress Notes (Signed)
Dr Karilyn Cota notified pt going for MRI. Wants MRI done tomorrow. Shane notified. Pt transferred to room per xray staff.

## 2012-02-19 NOTE — Anesthesia Postprocedure Evaluation (Addendum)
  Anesthesia Post-op Note  Patient: Lucas Patton  Procedure(s) Performed: Procedure(s) (LRB) with comments: ENDOSCOPIC RETROGRADE CHOLANGIOPANCREATOGRAPHY (ERCP) (N/A) SPHINCTEROTOMY (N/A) BILIARY STENT PLACEMENT (N/A)  Patient Location: PACU  Anesthesia Type:General  Level of Consciousness: awake, alert , oriented and patient cooperative  Airway and Oxygen Therapy: Patient Spontanous Breathing  Post-op Pain: none  Post-op Assessment: Post-op Vital signs reviewed, Patient's Cardiovascular Status Stable, Respiratory Function Stable, Patent Airway and Pain level controlled  Post-op Vital Signs: Reviewed and stable  Complications: No apparent anesthesia complications Pt doing well, going to [facility] this afternoon.  VSS no apparent anesthesia complications.

## 2012-02-19 NOTE — Op Note (Signed)
NAMEBELVIN, Lucas Patton                ACCOUNT NO.:  [id number]  MEDICAL RECORD NO.:  [id number]  LOCATION:  A323                          FACILITY:  [facility]  PHYSICIAN:  Lionel December, M.D.    DATE OF BIRTH:  [birth year]  DATE OF PROCEDURE:  02/19/2012 DATE OF DISCHARGE:                              OPERATIVE REPORT   PROCEDURE:  ERCP with sphincterotomy and biliary stenting.  INDICATION:  The patient is a 66 year old Caucasian male who presents with acute onset of abdominal pain, nausea, vomiting, fever, chills, jaundice, and he has dilated biliary system on ultrasound with cholelithiasis.  He suspected to have choledocholithiasis.  He is undergoing diagnostic and therapeutic ERCP.  Procedure risks were reviewed with the patient and informed consent was obtained.  MEDICATIONS FOR CONSCIOUS SEDATION/ANESTHESIA:  Please see anesthesia records for details.  FINDINGS:  Procedure performed in the OR.  Once the patient was given endotracheal general anesthesia, he was placed in semi-prone position. Time-out was carried out.  Olympus video duodenoscope was passed via oropharynx without any difficulty into esophagus and across the pylorus into bulb and descending duodenum.  Periampullary diverticulum was noted.  Ampulla water appeared to be normal, located to the right wall and below the diverticulum.  Few bulbar and postbulbar erosions were also noted.  Cannulation was attempted with Rx 44 Autotome and 035 hydra Jagwire.  Initially, pancreatic duct was cannulated and gently filled with dilute contrast and no abnormalities were noted.  CBD was then selectively cannulated and filled with dilute contrast.  CBD and distal aspect of CHD revealed normal filling with diameter of 7-8 mm.  However, there was a stricture at proximal and of common hepatic duct extending to the confluence of right and left hepatic system.  Both of the systems were dilated.  There was no filling of cystic duct or  gallbladder. There was preferential filling of left hepatic system.  The guidewire was advanced in the right hepatic system.  There was no filling of cystic duct or gallbladder.  Intrahepatic biliary radicals were dilated. Biliary sphincterotomy was performed.  Balloon stone extractor was passed into proximal CBD and occlusion cholangiogram attempted.  Could not get optimal filling of hilar region.  Balloon stone extractor was exchanged was removed.  A 10-French 9 cm plastic biliary stent was placed using one-step Microvasive system.  Proximal end of the stent was in the right hepatic system.  As the stent was deployed, there was flow of some contrast, but no bile was noted.  Endoscope was withdrawn.  The patient tolerated the procedure well.  The patient was extubated and taken to PACU.  FINAL DIAGNOSES: 1. Normal pancreatogram. 2. 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 3. Biliary sphincterotomy performed and 10-French 9 cm long plastic     stent was placed for decompression.  RECOMMENDATIONS: 1. We will proceed with MRCP with contrast in a.m. 2. As discussed with Dr. Marlyne Beards, cholecystectomy has been postponed. 3. We will also do H. pylori serology, since he has erosive bulbar and     postbulbar duodenitis.  Thank you very much.          ______________________________ Lionel December, M.D.     NR/MEDQ  D:  02/19/2012  T:  02/19/2012  Job:  [id number]

## 2012-02-19 NOTE — Anesthesia Procedure Notes (Signed)
Procedure Name: Intubation Date/Time: 02/19/2012 12:51 PM Performed by: Despina Hidden Pre-anesthesia Checklist: Emergency Drugs available, Suction available, Patient identified and Patient being monitored Patient Re-evaluated:Patient Re-evaluated prior to inductionOxygen Delivery Method: Circle system utilized Preoxygenation: Pre-oxygenation with 100% oxygen Intubation Type: IV induction, Cricoid Pressure applied and Rapid sequence Laryngoscope Size: Mac and 3 Grade View: Grade I Tube type: Oral Tube size: 8.0 mm Number of attempts: 1 Airway Equipment and Method: Stylet Placement Confirmation: ETT inserted through vocal cords under direct vision,  positive ETCO2 and breath sounds checked- equal and bilateral Secured at: 24 cm Tube secured with: Tape Dental Injury: Teeth and Oropharynx as per pre-operative assessment

## 2012-02-19 NOTE — Progress Notes (Signed)
Awake. Denies pain. Swallowing without difficulty. 

## 2012-02-19 NOTE — Brief Op Note (Signed)
02/18/2012 - 02/19/2012  2:05 PM  PATIENT:  Lucas Patton  66 y.o. male  PRE-OPERATIVE DIAGNOSIS:  Acute cholangitis.  POST-OPERATIVE DIAGNOSIS:  Acute cholangitis.  Stricture at H[place]  PROCEDURE:  Procedure(s) (LRB) with comments: ENDOSCOPIC RETROGRADE CHOLANGIOPANCREATOGRAPHY (ERCP) (N/A) SPHINCTEROTOMY (N/A) BILIARY STENT PLACEMENT (N/A)  SURGEON:  Surgeon(s) and Role:    * Malissa Hippo, MD - Primary  ERCP findings; Bulbar and post bulbar erosions. Periampullary diverticulum.  Normal pancreatogram. Stricture noted at proximal common hepatic duct and hilum. Dilated intrahepatic biliary radicles. Biliary sphincterotomy performed and 10 French 9 cm plastic stent placed for biliary decompression. Patient tolerated procedure well

## 2012-02-19 NOTE — Consult Note (Signed)
Reason for Consult:jaundice Referring Physician: Hospitalist.  Lucas Patton is an 66 y.o. male.  HPI: Admitted yesterday thru the ED. Saw Dr. Regino Schultze Saturday for jaundice and abdominal pain.   Noted to have abdominal LFTs.  [place] order by Dr. Regino Schultze.  Noted to have dilated CBD concerning for stone.  He has not been feeling for several weeks. He has lost approximately 10 pounds.  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    Past Medical History  Diagnosis Date  . Hypertension   . Thyroid disease   . Hypercholesteremia     Past Surgical History  Procedure Date  . Cardiac stents     History reviewed. No pertinent family history.  Social History:  reports that he has never smoked. He does not have any smokeless tobacco history on file. He reports that he does not drink alcohol or use illicit drugs.  Allergies: No Known Allergies  Medications: I have reviewed the patient's current medications.  Results for orders placed during the hospital encounter of 02/18/12 (from the past 48 hour(s))  CBC WITH DIFFERENTIAL     Status: Abnormal   Collection Time   02/18/12  6:00 PM      Component Value Range Comment   WBC 8.2  4.0 - 10.5 K/uL    RBC 4.63  4.22 - 5.81 MIL/uL    Hemoglobin 13.7  13.0 - 17.0 g/dL    HCT 16.1  09.6 - 04.5 %    MCV 87.3  78.0 - 100.0 fL    MCH 29.6  26.0 - 34.0 pg    MCHC 33.9  30.0 - 36.0 g/dL    RDW 40.9 (*) 81.1 - 15.5 %    Platelets 241  150 - 400 K/uL    Neutrophils Relative 65  43 - 77 %    Neutro Abs 5.3  1.7 - 7.7 K/uL    Lymphocytes Relative 21  12 - 46 %    Lymphs Abs 1.7  0.7 - 4.0 K/uL    Monocytes Relative 11  3 - 12 %    Monocytes Absolute 0.9  0.1 - 1.0 K/uL    Eosinophils Relative 2  0 - 5 %    Eosinophils Absolute 0.1  0.0 - 0.7 K/uL    Basophils Relative 1  0 - 1 %    Basophils Absolute 0.1  0.0 - 0.1 K/uL   COMPREHENSIVE METABOLIC PANEL     Status: Abnormal   Collection Time   02/18/12  6:00 PM      Component Value Range  Comment   Sodium 132 (*) 135 - 145 mEq/L    Potassium 3.5  3.5 - 5.1 mEq/L    Chloride 99  96 - 112 mEq/L    CO2 23  19 - 32 mEq/L    Glucose, Bld 111 (*) 70 - 99 mg/dL    BUN 13  6 - 23 mg/dL    Creatinine, Ser 9.14  0.50 - 1.35 mg/dL    Calcium 9.3  8.4 - 78.2 mg/dL    Total Protein 7.5  6.0 - 8.3 g/dL    Albumin 3.4 (*) 3.5 - 5.2 g/dL    AST 67 (*) 0 - 37 U/L    ALT 152 (*) 0 - 53 U/L    Alkaline Phosphatase 185 (*) 39 - 117 U/L    Total Bilirubin 9.2 (*) 0.3 - 1.2 mg/dL    GFR calc non Af Denyse Dago  76 (*) >90 mL/min    GFR calc Af Amer 89 (*) >90 mL/min   LIPASE, BLOOD     Status: Normal   Collection Time   02/18/12  6:00 PM      Component Value Range Comment   Lipase 14  11 - 59 U/L   TROPONIN I     Status: Normal   Collection Time   02/18/12  6:18 PM      Component Value Range Comment   Troponin I <0.30  <0.30 ng/mL   URINALYSIS, ROUTINE W REFLEX MICROSCOPIC     Status: Abnormal   Collection Time   02/19/12 12:02 AM      Component Value Range Comment   Color, Urine BROWN (*) YELLOW BIOCHEMICALS MAY BE AFFECTED BY COLOR   APPearance CLEAR  CLEAR    Specific Gravity, Urine 1.020  1.005 - 1.030    pH 5.5  5.0 - 8.0    Glucose, UA 100 (*) NEGATIVE mg/dL    Hgb urine dipstick NEGATIVE  NEGATIVE    Bilirubin Urine LARGE (*) NEGATIVE    Ketones, ur NEGATIVE  NEGATIVE mg/dL    Protein, ur NEGATIVE  NEGATIVE mg/dL    Urobilinogen, UA 0.2  0.0 - 1.0 mg/dL    Nitrite NEGATIVE  NEGATIVE    Leukocytes, UA NEGATIVE  NEGATIVE MICROSCOPIC NOT DONE ON URINES WITH NEGATIVE PROTEIN, BLOOD, LEUKOCYTES, NITRITE, OR GLUCOSE <1000 mg/dL.  LIPID PANEL     Status: Abnormal   Collection Time   02/19/12  5:01 AM      Component Value Range Comment   Cholesterol 209 (*) 0 - 200 mg/dL    Triglycerides 161 (*) <150 mg/dL    HDL 6 (*) >09 mg/dL    Total CHOL/HDL Ratio 34.8      VLDL 44 (*) 0 - 40 mg/dL    LDL Cholesterol 604 (*) 0 - 99 mg/dL   COMPREHENSIVE METABOLIC PANEL     Status: Abnormal     Collection Time   02/19/12  5:01 AM      Component Value Range Comment   Sodium 137  135 - 145 mEq/L    Potassium 3.5  3.5 - 5.1 mEq/L    Chloride 102  96 - 112 mEq/L    CO2 25  19 - 32 mEq/L    Glucose, Bld 105 (*) 70 - 99 mg/dL    BUN 10  6 - 23 mg/dL    Creatinine, Ser 5.40  0.50 - 1.35 mg/dL    Calcium 9.1  8.4 - 98.1 mg/dL    Total Protein 6.6  6.0 - 8.3 g/dL    Albumin 2.9 (*) 3.5 - 5.2 g/dL    AST 61 (*) 0 - 37 U/L    ALT 127 (*) 0 - 53 U/L    Alkaline Phosphatase 170 (*) 39 - 117 U/L    Total Bilirubin 9.5 (*) 0.3 - 1.2 mg/dL    GFR calc non Af Amer 88 (*) >90 mL/min    GFR calc Af Amer >90  >90 mL/min   CBC     Status: Abnormal   Collection Time   02/19/12  5:01 AM      Component Value Range Comment   WBC 6.3  4.0 - 10.5 K/uL    RBC 4.51  4.22 - 5.81 MIL/uL    Hemoglobin 13.2  13.0 - 17.0 g/dL    HCT 19.1  47.8 - 29.5 %    MCV 87.1  78.0 - 100.0 fL    MCH 29.3  26.0 - 34.0 pg    MCHC 33.6  30.0 - 36.0 g/dL    RDW 40.9 (*) 81.1 - 15.5 %    Platelets 213  150 - 400 K/uL   PROTIME-INR     Status: Normal   Collection Time   02/19/12  5:01 AM      Component Value Range Comment   Prothrombin Time 12.9  11.6 - 15.2 seconds    INR 0.98  0.00 - 1.49   GLUCOSE, CAPILLARY     Status: Abnormal   Collection Time   02/19/12  7:25 AM      Component Value Range Comment   Glucose-Capillary 133 (*) 70 - 99 mg/dL     Dg Chest 2 View  91/47/8295  *RADIOLOGY REPORT*  Clinical Data: Pre operative respiratory exam for cholelithiasis and biliary obstruction.  CHEST - 2 VIEW  Comparison: 10/06/2004  Findings: Stable chronic lung disease and bibasilar scarring. There are also likely bilateral small calcified granulomata which appear stable.  No pulmonary infiltrate, edema or pleural fluid identified.  The heart size is stable and within normal limits. The bony thorax is unremarkable.  IMPRESSION: Stable chronic lung disease and evidence of prior granulomatous disease.  No acute  process identified.   Original Report Authenticated By: Reola Calkins, M.D.    US Abdomen Complete  02/18/2012  *RADIOLOGY REPORT*  Clinical Data:  Jaundice, abdominal pain.  COMPLETE ABDOMINAL ULTRASOUND  Comparison:  03/28/2004  Findings:  Gallbladder:  Gallbladder is filled with gallstones.  The gallbladder wall is difficult to visualize due to the extensive gallstones.  Common bile duct:   Common bile duct is dilated, measuring 10 mm. Echogenic area within the common bile duct concerning for CBD stone.  Liver:  Intrahepatic biliary ductal dilatation.  Normal echotexture.  No focal abnormality.  IVC:  Appears normal.  Pancreas:  Borderline pancreatic duct at 4 mm.  No focal parenchymal abnormality.  Spleen:  Within normal limits in size and echotexture.  Right Kidney:   Normal in size and parenchymal echogenicity.  No evidence of mass or hydronephrosis.  Left Kidney:  Normal in size and parenchymal echogenicity.  No evidence of mass or hydronephrosis.  Abdominal aorta:  No aneurysm identified.  IMPRESSION: The gallbladder is filled with gallstones.  Findings concerning for CBD stone with extrahepatic and intrahepatic biliary ductal dilatation.   Original Report Authenticated By: Cyndie Chime, M.D.     ROS Blood pressure 139/86, pulse 75, temperature 98.4 F (36.9 C), temperature source Oral, resp. rate 20, height 6\' 2"  (1.88 m), weight 230 lb 9.6 oz (104.6 kg), SpO2 93.00%. Physical Exam Alert and oriented. Skin warm and dry. Skin yellow Oral mucosa is moist.   . Sclera icteric, conjunctivae is pink. Thyroid not enlarged. No cervical lymphadenopathy. Lungs clear. Heart regular rate and rhythm.  Abdomen is soft. Bowel sounds are positive. No hepatomegaly. No abdominal masses felt. Tenderness to palpation to rt upper quandrant.  No edema to lower extremities.   Assessment/Plan: Dilated CBD concerning for stone.   Plan: ERCP with Dr. Karilyn Cota.  SETZER,TERRI W 02/19/2012, 8:16 AM     GI  attending note; I have interviewed and examined the patient. I reviewed imaging studies as well as lab data. Patient has acute cholangitis secondary to choledocholithiasis. He is on IV Zosyn and does not appear to be acutely ill. He'll need ERCP to clear CBD of stone prior to cholecystectomy. Procedure and  risks reviewed with the patient and his wife and they're both agreeable. He'll undergo ERCP later today.

## 2012-02-20 ENCOUNTER — Observation Stay (HOSPITAL_COMMUNITY): Payer: Medicare HMO

## 2012-02-20 ENCOUNTER — Ambulatory Visit: Admit: 2012-02-20 | Payer: Self-pay | Admitting: General Surgery

## 2012-02-20 DIAGNOSIS — K8020 Calculus of gallbladder without cholecystitis without obstruction: Secondary | ICD-10-CM

## 2012-02-20 DIAGNOSIS — K831 Obstruction of bile duct: Secondary | ICD-10-CM

## 2012-02-20 LAB — URINE CULTURE

## 2012-02-20 LAB — COMPREHENSIVE METABOLIC PANEL
BUN: 10 mg/dL (ref 6–23)
CO2: 26 mEq/L (ref 19–32)
Chloride: 102 mEq/L (ref 96–112)
Creatinine, Ser: 0.84 mg/dL (ref 0.50–1.35)
GFR calc Af Amer: >90 mL/min (ref >90)
GFR calc non Af Amer: 89 mL/min — ABNORMAL LOW (ref >90)
Total Bilirubin: 10.1 mg/dL — ABNORMAL HIGH (ref 0.3–1.2)

## 2012-02-20 LAB — GLUCOSE, CAPILLARY: Glucose-Capillary: 144 mg/dL — ABNORMAL HIGH (ref 70–99)

## 2012-02-20 LAB — HEMOGLOBIN A1C: Mean Plasma Glucose: 126 mg/dL — ABNORMAL HIGH (ref <117)

## 2012-02-20 LAB — CBC
HCT: 36.9 % — ABNORMAL LOW (ref 39.0–52.0)
MCH: 29.6 pg (ref 26.0–34.0)
MCV: 86.8 fL (ref 78.0–100.0)
RBC: 4.25 MIL/uL (ref 4.22–5.81)
WBC: 8.7 10*3/uL (ref 4.0–10.5)

## 2012-02-20 LAB — AMYLASE: Amylase: 25 U/L (ref 0–105)

## 2012-02-20 SURGERY — LAPAROSCOPIC CHOLECYSTECTOMY
Anesthesia: General

## 2012-02-20 MED ORDER — GADOBENATE DIMEGLUMINE 529 MG/ML IV SOLN
20.0000 mL | Freq: Once | INTRAVENOUS | Status: AC | PRN
  Administered 2012-02-20: 20 mL via INTRAVENOUS

## 2012-02-20 MED ORDER — PANTOPRAZOLE SODIUM 40 MG PO TBEC
40.0000 mg | DELAYED_RELEASE_TABLET | Freq: Every day | ORAL | Status: DC
  Administered 2012-02-20: 40 mg via ORAL
  Filled 2012-02-20: qty 1

## 2012-02-20 MED ORDER — ALUM & MAG HYDROXIDE-SIMETH 200-200-20 MG/5ML PO SUSP
30.0000 mL | ORAL | Status: DC | PRN
  Administered 2012-02-20: 30 mL via ORAL
  Filled 2012-02-20: qty 30

## 2012-02-20 NOTE — Progress Notes (Signed)
Patient feels about the same. He still has pain in right upper quadrant. He is keeping liquids down. He denies fever or chills. Patient appears comfortable in chair. He remains jaundiced. Pulse 79 per minute,BP 124/70, temp 98.4 and RR 18. Lab data; Amylase 25. Bilirubin 10.1, AP 117, AST 72, ALT 118 and albumin 2.6. WBC 8.7, H&H 12.6 and 36.9 and platelet count 211 K. MRCP reviewed with Dr. Kearney Hard and Dr. Amil Amen. 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 Multiple gallstones in contracted gallbladder which appeared to be close to common hepatic duct. Biliary stent not seen. 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 His cholestasis has not improved with stenting. Suspect stent may have migrated distally. 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 Patient will need cholangioscopy possibly in both systems will need to be stented. Findings discussed with patient and family members and will arrange for patient to be transferred to [facility].

## 2012-02-20 NOTE — Discharge Summary (Addendum)
Physician Discharge Summary  Patient ID: Lucas Patton MRN: [id number] DOB/AGE: 1946-03-20 66 y.o.  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   status post biliary sphincterotomy and 10 French 9 cm plastic stent placed Active Problems:  CAD S/P percutaneous coronary angioplasty  HTN (hypertension)  Hypothyroidism  HLD (hyperlipidemia) cholelithiasis  Scheduled Meds:   . alum & mag hydroxide-simeth  30 mL Oral Once  . levothyroxine  175 mcg Oral QAC breakfast  . lisinopril  20 mg Oral QHS  . pantoprazole  40 mg Oral Daily  . piperacillin-tazobactam (ZOSYN)  IV  3.375 g Intravenous Q8H  . sodium chloride  3 mL Intravenous Q12H  . traZODone  100 mg Oral QHS  . verapamil  240 mg Oral q morning - 10a   Continuous Infusions:   . sodium chloride 75 mL/hr at 02/19/12 2120   PRN Meds:.acetaminophen, acetaminophen, alum & mag hydroxide-simeth, gadobenate dimeglumine, morphine injection, ondansetron (ZOFRAN) IV, ondansetron   Disposition: transfer to [facility]  Discharged Condition: stable  Consults: Treatment Team:  Malissa Hippo, MD Dalia Heading, MD  Labs:   Results for orders placed during the hospital encounter of 02/18/12 (from the past 48 hour(s))  CBC WITH DIFFERENTIAL     Status: Abnormal   Collection Time   02/18/12  6:00 PM      Component Value Range Comment   WBC 8.2  4.0 - 10.5 K/uL    RBC 4.63  4.22 - 5.81 MIL/uL    Hemoglobin 13.7  13.0 - 17.0 g/dL    HCT 40.9  81.1 - 91.4 %    MCV 87.3  78.0 - 100.0 fL    MCH 29.6  26.0 - 34.0 pg    MCHC 33.9  30.0 - 36.0 g/dL    RDW 78.2 (*) 95.6 - 15.5 %    Platelets 241  150 - 400 K/uL    Neutrophils Relative 65  43 - 77 %    Neutro Abs 5.3  1.7 - 7.7 K/uL    Lymphocytes Relative 21  12 -  46 %    Lymphs Abs 1.7  0.7 - 4.0 K/uL    Monocytes Relative 11  3 - 12 %    Monocytes Absolute 0.9  0.1 - 1.0 K/uL    Eosinophils Relative 2  0 - 5 %    Eosinophils Absolute 0.1  0.0 - 0.7 K/uL    Basophils Relative 1  0 - 1 %    Basophils Absolute 0.1  0.0 - 0.1 K/uL   COMPREHENSIVE METABOLIC PANEL     Status: Abnormal   Collection Time   02/18/12  6:00 PM      Component Value Range Comment   Sodium 132 (*) 135 - 145 mEq/L    Potassium 3.5  3.5 - 5.1 mEq/L    Chloride 99  96 - 112 mEq/L    CO2 23  19 - 32 mEq/L    Glucose, Bld 111 (*) 70 - 99 mg/dL    BUN 13  6 - 23 mg/dL    Creatinine, Ser 2.13  0.50 - 1.35 mg/dL    Calcium 9.3  8.4 - 08.6 mg/dL    Total Protein 7.5  6.0 - 8.3 g/dL    Albumin 3.4 (*) 3.5 - 5.2 g/dL  AST 67 (*) 0 - 37 U/L    ALT 152 (*) 0 - 53 U/L    Alkaline Phosphatase 185 (*) 39 - 117 U/L    Total Bilirubin 9.2 (*) 0.3 - 1.2 mg/dL    GFR calc non Af Amer 76 (*) >90 mL/min    GFR calc Af Amer 89 (*) >90 mL/min   LIPASE, BLOOD     Status: Normal   Collection Time   02/18/12  6:00 PM      Component Value Range Comment   Lipase 14  11 - 59 U/L   TROPONIN I     Status: Normal   Collection Time   02/18/12  6:18 PM      Component Value Range Comment   Troponin I <0.30  <0.30 ng/mL   TSH     Status: Normal   Collection Time   02/18/12  9:19 PM      Component Value Range Comment   TSH 1.836  0.350 - 4.500 uIU/mL   HEMOGLOBIN A1C     Status: Abnormal   Collection Time   02/18/12  9:19 PM      Component Value Range Comment   Hemoglobin A1C 6.0 (*) <5.7 %    Mean Plasma Glucose 126 (*) <117 mg/dL   URINALYSIS, ROUTINE W REFLEX MICROSCOPIC     Status: Abnormal   Collection Time   02/19/12 12:02 AM      Component Value Range Comment   Color, Urine BROWN (*) YELLOW BIOCHEMICALS MAY BE AFFECTED BY COLOR   APPearance CLEAR  CLEAR    Specific Gravity, Urine 1.020  1.005 - 1.030    pH 5.5  5.0 - 8.0    Glucose, UA 100 (*) NEGATIVE mg/dL    Hgb urine  dipstick NEGATIVE  NEGATIVE    Bilirubin Urine LARGE (*) NEGATIVE    Ketones, ur NEGATIVE  NEGATIVE mg/dL    Protein, ur NEGATIVE  NEGATIVE mg/dL    Urobilinogen, UA 0.2  0.0 - 1.0 mg/dL    Nitrite NEGATIVE  NEGATIVE    Leukocytes, UA NEGATIVE  NEGATIVE MICROSCOPIC NOT DONE ON URINES WITH NEGATIVE PROTEIN, BLOOD, LEUKOCYTES, NITRITE, OR GLUCOSE <1000 mg/dL.  URINE CULTURE     Status: Normal   Collection Time   02/19/12 12:02 AM      Component Value Range Comment   Specimen Description URINE, CLEAN CATCH      Special Requests NONE      Culture  Setup Time 02/19/2012 20:11      Colony Count NO GROWTH      Culture NO GROWTH      Report Status 02/20/2012 FINAL     LIPID PANEL     Status: Abnormal   Collection Time   02/19/12  5:01 AM      Component Value Range Comment   Cholesterol 209 (*) 0 - 200 mg/dL    Triglycerides 161 (*) <150 mg/dL    HDL 6 (*) >09 mg/dL    Total CHOL/HDL Ratio 34.8      VLDL 44 (*) 0 - 40 mg/dL    LDL Cholesterol 604 (*) 0 - 99 mg/dL   COMPREHENSIVE METABOLIC PANEL     Status: Abnormal   Collection Time   02/19/12  5:01 AM      Component Value Range Comment   Sodium 137  135 - 145 mEq/L    Potassium 3.5  3.5 - 5.1 mEq/L    Chloride 102  96 - 112  mEq/L    CO2 25  19 - 32 mEq/L    Glucose, Bld 105 (*) 70 - 99 mg/dL    BUN 10  6 - 23 mg/dL    Creatinine, Ser 1.61  0.50 - 1.35 mg/dL    Calcium 9.1  8.4 - 09.6 mg/dL    Total Protein 6.6  6.0 - 8.3 g/dL    Albumin 2.9 (*) 3.5 - 5.2 g/dL    AST 61 (*) 0 - 37 U/L    ALT 127 (*) 0 - 53 U/L    Alkaline Phosphatase 170 (*) 39 - 117 U/L    Total Bilirubin 9.5 (*) 0.3 - 1.2 mg/dL    GFR calc non Af Amer 88 (*) >90 mL/min    GFR calc Af Amer >90  >90 mL/min   CBC     Status: Abnormal   Collection Time   02/19/12  5:01 AM      Component Value Range Comment   WBC 6.3  4.0 - 10.5 K/uL    RBC 4.51  4.22 - 5.81 MIL/uL    Hemoglobin 13.2  13.0 - 17.0 g/dL    HCT 04.5  40.9 - 81.1 %    MCV 87.1  78.0 - 100.0 fL     MCH 29.3  26.0 - 34.0 pg    MCHC 33.6  30.0 - 36.0 g/dL    RDW 91.4 (*) 78.2 - 15.5 %    Platelets 213  150 - 400 K/uL   PROTIME-INR     Status: Normal   Collection Time   02/19/12  5:01 AM      Component Value Range Comment   Prothrombin Time 12.9  11.6 - 15.2 seconds    INR 0.98  0.00 - 1.49   GLUCOSE, CAPILLARY     Status: Abnormal   Collection Time   02/19/12  7:25 AM      Component Value Range Comment   Glucose-Capillary 133 (*) 70 - 99 mg/dL   SURGICAL PCR SCREEN     Status: Normal   Collection Time   02/19/12  7:26 AM      Component Value Range Comment   MRSA, PCR NEGATIVE  NEGATIVE    Staphylococcus aureus NEGATIVE  NEGATIVE   GLUCOSE, CAPILLARY     Status: Abnormal   Collection Time   02/19/12  2:04 PM      Component Value Range Comment   Glucose-Capillary 155 (*) 70 - 99 mg/dL   CBC     Status: Abnormal   Collection Time   02/20/12  5:43 AM      Component Value Range Comment   WBC 8.7  4.0 - 10.5 K/uL    RBC 4.25  4.22 - 5.81 MIL/uL    Hemoglobin 12.6 (*) 13.0 - 17.0 g/dL    HCT 95.6 (*) 21.3 - 52.0 %    MCV 86.8  78.0 - 100.0 fL    MCH 29.6  26.0 - 34.0 pg    MCHC 34.1  30.0 - 36.0 g/dL    RDW 08.6 (*) 57.8 - 15.5 %    Platelets 211  150 - 400 K/uL   COMPREHENSIVE METABOLIC PANEL     Status: Abnormal   Collection Time   02/20/12  5:43 AM      Component Value Range Comment   Sodium 136  135 - 145 mEq/L    Potassium 3.8  3.5 - 5.1 mEq/L    Chloride 102  96 - 112  mEq/L    CO2 26  19 - 32 mEq/L    Glucose, Bld 128 (*) 70 - 99 mg/dL    BUN 10  6 - 23 mg/dL    Creatinine, Ser 5.28  0.50 - 1.35 mg/dL    Calcium 8.7  8.4 - 41.3 mg/dL    Total Protein 6.2  6.0 - 8.3 g/dL    Albumin 2.6 (*) 3.5 - 5.2 g/dL    AST 72 (*) 0 - 37 U/L    ALT 118 (*) 0 - 53 U/L    Alkaline Phosphatase 179 (*) 39 - 117 U/L    Total Bilirubin 10.1 (*) 0.3 - 1.2 mg/dL    GFR calc non Af Amer 89 (*) >90 mL/min    GFR calc Af Amer >90  >90 mL/min   AMYLASE     Status: Normal    Collection Time   02/20/12  5:43 AM      Component Value Range Comment   Amylase 25  0 - 105 U/L   GLUCOSE, CAPILLARY     Status: Abnormal   Collection Time   02/20/12  7:39 AM      Component Value Range Comment   Glucose-Capillary 144 (*) 70 - 99 mg/dL    Comment 1 Documented in Chart      Comment 2 Notify RN       Diagnostics:  Dg Chest 2 View  02/18/2012  *RADIOLOGY REPORT*  Clinical Data: Pre operative respiratory exam for cholelithiasis and biliary obstruction.  CHEST - 2 VIEW  Comparison: 10/06/2004  Findings: Stable chronic lung disease and bibasilar scarring. There are also likely bilateral small calcified granulomata which appear stable.  No pulmonary infiltrate, edema or pleural fluid identified.  The heart size is stable and within normal limits. The bony thorax is unremarkable.  IMPRESSION: Stable chronic lung disease and evidence of prior granulomatous disease.  No acute process identified.   Original Report Authenticated By: Reola Calkins, M.D.    US Abdomen Complete  02/18/2012  *RADIOLOGY REPORT*  Clinical Data:  Jaundice, abdominal pain.  COMPLETE ABDOMINAL ULTRASOUND  Comparison:  03/28/2004  Findings:  Gallbladder:  Gallbladder is filled with gallstones.  The gallbladder wall is difficult to visualize due to the extensive gallstones.  Common bile duct:   Common bile duct is dilated, measuring 10 mm. Echogenic area within the common bile duct concerning for CBD stone.  Liver:  Intrahepatic biliary ductal dilatation.  Normal echotexture.  No focal abnormality.  IVC:  Appears normal.  Pancreas:  Borderline pancreatic duct at 4 mm.  No focal parenchymal abnormality.  Spleen:  Within normal limits in size and echotexture.  Right Kidney:   Normal in size and parenchymal echogenicity.  No evidence of mass or hydronephrosis.  Left Kidney:  Normal in size and parenchymal echogenicity.  No evidence of mass or hydronephrosis.  Abdominal aorta:  No aneurysm identified.  IMPRESSION: The  gallbladder is filled with gallstones.  Findings concerning for CBD stone with extrahepatic and intrahepatic biliary ductal dilatation.   Original Report Authenticated By: Cyndie Chime, M.D.    Mr 3d Recon At Scanner  02/20/2012  *RADIOLOGY REPORT*  Clinical Data:  Obstructive jaundice.  MRI ABDOMEN WITHOUT AND WITH CONTRAST (INCLUDING MRCP)  Technique:  Multiplanar multisequence MR imaging of the abdomen was performed both before and after the administration of intravenous contrast. Heavily T2-weighted images of the biliary and pancreatic ducts were obtained, and three-dimensional MRCP images were rendered by post processing.  Contrast: 20mL MULTIHANCE GADOBENATE DIMEGLUMINE 529 MG/ML IV SOLN  Comparison:  Ultrasound 02/18/2012, ERCP 02/19/2012  Findings:  MRCP  images demonstrate dilatation of the left and right extrahepatic ducts up to near confluence where there is high grade stricturing at the proximal common hepatic duct.  There is a intrahepatic biliary ductal dilatation which appears evenly distributed from the left and right hepatic lobes.  The common bile duct is poorly delineated on the T2-weighted imaging.  A biliary stent was placed to the level of this obstructionin the common hepatic duct as evidenced on ERCP.  The stent is evident but poorly delineated on MRI.  On the postcontrast sequences, including delayed sequences, there is no discrete enhancing lesion at the level of the stricture of the common hepatic duct.  The gallbladder contains multiple large gallstones.  The gallbladder is contracted around gallstones.  The gallstones within superior gallbladder due approached the common hepatic duct.  It is conceivable that one of these stones could be compressing the common hepatic duct proximally.  There is no focal enhancing hepatic lesion.  The pancreas, spleen, adrenal glands, kidneys are normal.  There is atherosclerotic disease of the aorta.  There is small new periportal lymph nodes which  are not pathologic by size criteria. There is a small periportal lymph node within normal limits.  IMPRESSION:  1.  High-grade obstruction of the common hepatic duct at the level of the confluence of the left and right extrahepatic ducts.  This is typical location for a Klatskin type tumor although no enhancing lesion is evident. 2. Multiple gallstones within the contracted gallbladder.  The most superior of gallstones do approximate the proximal common hepatic duct.  Cannot exclude external compression of the common bile duct as cause of the stricturing  (Mirizzi Syndrome) . 3.  No evidence of hepatic metastasis 4.  No evidence of lymphadenopathy.  Findings discussed with Dr. Karilyn Cota on 02/20/2012 at 10:30 a.m.   Original Report Authenticated By: Genevive Bi, M.D.    Dg Ercp With Sphincterotomy  02/19/2012  *RADIOLOGY REPORT*  Clinical Data: Biliary ductal dilatation on prior ultrasound.  ERCP  Comparison:  Ultrasound of 02/18/2012  Technique:  Multiple spot images obtained with the fluoroscopic device and submitted for interpretation post-procedure.  ERCP was performed by Dr. .  Newman Nickels: Initial injection into the common duct and pancreatic duct demonstrates normal pancreatic duct caliber.  The common duct is upper normal.  No evidence of choledocholithiasis.  There is a persistent area of narrowing in the porta hepatis region with mild to moderate intrahepatic biliary ductal dilatation.  Example image 130 of series one.  Subsequent images demonstrate balloon pull- through within the common duct.  The final image demonstrates placement of a common duct stent.  IMPRESSION: Biliary narrowing in the region of the porta hepatis. Considerations include cholangiocarcinoma or Mirizzi's syndrome. Case was discussed with Dr. .  MRCP is pending.  These images were submitted for radiologic interpretation only. Please see the procedural report for the amount of contrast and the fluoroscopy time utilized.   Original  Report Authenticated By: Consuello Bossier, M.D.    Mr Jorja Loa Cm/mrcp  02/20/2012  *RADIOLOGY REPORT*  Clinical Data:  Obstructive jaundice.  MRI ABDOMEN WITHOUT AND WITH CONTRAST (INCLUDING MRCP)  Technique:  Multiplanar multisequence MR imaging of the abdomen was performed both before and after the administration of intravenous contrast. Heavily T2-weighted images of the biliary and pancreatic ducts were obtained, and three-dimensional MRCP images were rendered by post processing.  Contrast: 20mL MULTIHANCE GADOBENATE DIMEGLUMINE 529 MG/ML IV SOLN  Comparison:  Ultrasound 02/18/2012, ERCP 02/19/2012  Findings:  MRCP  images demonstrate dilatation of the left and right extrahepatic ducts up to near confluence where there is high grade stricturing at the proximal common hepatic duct.  There is a intrahepatic biliary ductal dilatation which appears evenly distributed from the left and right hepatic lobes.  The common bile duct is poorly delineated on the T2-weighted imaging.  A biliary stent was placed to the level of this obstructionin the common hepatic duct as evidenced on ERCP.  The stent is evident but poorly delineated on MRI.  On the postcontrast sequences, including delayed sequences, there is no discrete enhancing lesion at the level of the stricture of the common hepatic duct.  The gallbladder contains multiple large gallstones.  The gallbladder is contracted around gallstones.  The gallstones within superior gallbladder due approached the common hepatic duct.  It is conceivable that one of these stones could be compressing the common hepatic duct proximally.  There is no focal enhancing hepatic lesion.  The pancreas, spleen, adrenal glands, kidneys are normal.  There is atherosclerotic disease of the aorta.  There is small new periportal lymph nodes which are not pathologic by size criteria. There is a small periportal lymph node within normal limits.  IMPRESSION:  1.  High-grade obstruction of the common  hepatic duct at the level of the confluence of the left and right extrahepatic ducts.  This is typical location for a Klatskin type tumor although no enhancing lesion is evident. 2. Multiple gallstones within the contracted gallbladder.  The most superior of gallstones do approximate the proximal common hepatic duct.  Cannot exclude external compression of the common bile duct as cause of the stricturing  (Mirizzi Syndrome) . 3.  No evidence of hepatic metastasis 4.  No evidence of lymphadenopathy.  Findings discussed with Dr. Karilyn Cota on 02/20/2012 at 10:30 a.m.   Original Report Authenticated By: Genevive Bi, M.D.     Procedures:  ERCP with sphincterotomy and biliary stenting.   EKG: Normal sinus rhythm Right bundle branch block  Full Code   Hospital Course: See H&P for complete admission details. The patient is a 66 year old white male with a history of hyperlipidemia and heart disease who presents with subjective fevers chills jaundice and progressive abdominal pain. His primary care provider ordered an outpatient abdominal ultrasound which showed common bile duct stone and cholelithiasis with intra-and extrahepatic ductal dilatation suspicious for obstruction. In the emergency room, he was afebrile and had normal vital signs. He was jaundiced and nontoxic appearing. Right upper quadrant tender. Total bilirubin was 9.2. Alkaline phosphatase 185. AST 67. ALT 152. Lipase normal. White blood cell count normal.  Patient was admitted to the hospitalist service. GI and surgery were consulted. Patient was started on Zosyn for concerns regarding a sending cholangitis. ERCP was done by Dr. Dionicia Abler who performed also sphincterotomy and biliary stent placement. A stricture at the proximal common hepatic duct and hilum was noted. MRCP showed high-grade obstruction of the common hepatic duct at the level of the confluence of left and right hepatic ducts, possibly Klatskin tumor versus Mirizzi syndrome. Patient's  liver function tests remained elevated despite stent placement. Dr. Dionicia Abler has discussed the case with physicians at [facility] who have accepted the patient in transfer for cholangioscopy and possible further stenting in consideration of cholecystectomy. Patient has been medically stable and we are awaiting word regarding bed availability. Total time on the day of  transfer greater than 30 minutes  Discharge Exam:  Blood pressure 117/57, pulse 67, temperature 98.9 F (37.2 C), temperature source Oral, resp. rate 18, height 6\' 2"  (1.88 m), weight 104.6 kg (230 lb 9.6 oz), SpO2 95.00%.  General: Sitting in a chair. Jaundice. Lungs clear to auscultation bilaterally without wheeze rhonchi or rales Cardiovascular regular rate rhythm without murmurs gallops rubs Abdomen soft right upper quadrant tenderness Extremities no edema  Signed: Bradshaw Minihan L 02/20/2012, 3:37 PM

## 2012-02-20 NOTE — Addendum Note (Signed)
Addendum  created 02/20/12 1354 by Moshe Salisbury, CRNA   Modules edited:Notes Section

## 2012-02-21 ENCOUNTER — Encounter (HOSPITAL_COMMUNITY): Payer: Self-pay | Admitting: Internal Medicine

## 2012-02-21 NOTE — Progress Notes (Addendum)
Patient transferred to [facility]  Room 8123 via [facility] No c/o pain at d/c Family aware of transfer Discharge packet sent with transport team Report called to Desire, RN Marney Setting, Kae Heller

## 2012-06-21 HISTORY — PX: ERCP W/ METAL STENT PLACEMENT: SHX1521

## 2012-07-22 ENCOUNTER — Encounter (HOSPITAL_COMMUNITY): Payer: Self-pay | Admitting: Oncology

## 2012-07-22 ENCOUNTER — Encounter (HOSPITAL_COMMUNITY): Payer: Medicare HMO | Attending: Oncology | Admitting: Oncology

## 2012-07-22 VITALS — BP 134/76 | HR 87 | Temp 98.0°F | Resp 16 | Ht 74.0 in | Wt 208.3 lb

## 2012-07-22 DIAGNOSIS — C786 Secondary malignant neoplasm of retroperitoneum and peritoneum: Secondary | ICD-10-CM

## 2012-07-22 DIAGNOSIS — M7989 Other specified soft tissue disorders: Secondary | ICD-10-CM

## 2012-07-22 DIAGNOSIS — C221 Intrahepatic bile duct carcinoma: Secondary | ICD-10-CM | POA: Insufficient documentation

## 2012-07-22 DIAGNOSIS — C768 Malignant neoplasm of other specified ill-defined sites: Secondary | ICD-10-CM | POA: Insufficient documentation

## 2012-07-22 NOTE — Progress Notes (Signed)
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 Jola Babinski at [facility]. This gentleman is 67 years old who presented with nausea vomiting and hyperbilirubinemia and fevers in October 2013. He was transferred to [facility] for further evaluation which revealed porta hepatis soft tissue mass measuring 3.3 x 4.0 cm. ERCP with brushings was nondiagnostic and a stent was placed. He improved but his stent had to be revised with a metal stent across the stenotic area at the bifurcation of the common bile duct with the right and left hepatic bile ducts. He has done better since then. At one time he had extra abdominal drainage bags which were removed. He was eventually considered for operation for possible surgical resection but at the time of diagnostic laparoscopy on 06/26/2012 Dr. Jola Babinski found peritoneal implants consistent with metastatic cholangiocarcinoma. He was then seen in consultation by Dr. Maryruth Hancock who has recommended palliative chemotherapy. He would like to be treated closer to home. He is here today with his wife. #2 coronary artery disease with arrhythmias in the past status post ablation as well as 2 stents placed in the past #3 thyroid disease #4 bilateral cataract operations with lens implants #4 excessive weight in the past with sleep apnea syndrome for which he uses a CPAP machine #5 tonsillectomy in the distant past. #6 lower extremity swelling since he left [facility] bilaterally symmetrical below the knees. Dopplers will we done in the morning to rule out occult DVT. Presently he is asymptomatic without pain or shortness of breath.  Very pleasant gentleman who is from this area. He has been in [employer] her many many years. 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 He came home essentially jaundice not feeling well  in October 2013. The above diagnoses within found after the procedures mentioned above.  He has clear-cut metastatic disease. He is consider today for palliative chemotherapy locally rather than at [facility] since this would be easier for him to receive therapy closer to home.  He has lost 50 pounds in weight since this illness started. He no longer has fevers or chills however. His appetite is starting to return. His medications are listed in the chart summary. Bowel function is good as well as is taking his lactulose. Nausea is not much of an issue presently.  He and his wife have one daughter who lives close by. His parents are both deceased.  He was a smoker for many years.  BP 134/76  Pulse 87  Temp(Src) 98 F (36.7 C) (Oral)  Resp 16  Ht 6\' 2"  (1.88 m)  Wt 208 lb 4.8 oz (94.484 kg)  BMI 26.73 kg/m2  He is in no acute distress. He is clearly not jaundiced. He has no obvious adenopathy in any location. His lungs are clear. Heart shows a regular rhythm and rate at this time without obvious murmur rub or gallop. He does not have clear-cut gynecomastia. He appears have muscle wasting in multiple muscle groups. His incisions on his abdomen a well-healed. He has no obvious hepatosplenomegaly or tenderness. Bowel sounds are active. He does have 1+ to 2+ pitting edema below the knees. I cannot feel pulses easily in his feet. He is alert and oriented. Eyegrounds show the changes of prior cataract operations. Throat is clear. Teeth are in good repair. Facial symmetry is intact. He is alert and oriented.  Skin exam shows no suspicious lesions other than patches of psoriasis. One is around the umbilicus the other is on the left flank.  He has no clubbing or cyanosis.  We will get a local surgeon to place a Port-A-Cath as soon as possible and then embark upon cis-platinum and gemcitabine day 1 and day 8 for approximately 3-6 cycles and then reassess him both with a baseline CAT scan now as well as after  3-6 cycles of chemotherapy along with his cancer marker which has been elevated. We'll obtain a new baseline value for that as well.  We will get Dr. Lovell Sheehan to place a port. Hopefully we can get started next week.

## 2012-07-22 NOTE — Patient Instructions (Addendum)
.  [facility] Discharge Instructions  RECOMMENDATIONS MADE BY THE CONSULTANT AND ANY TEST RESULTS WILL BE SENT TO YOUR REFERRING PHYSICIAN.  EXAM FINDINGS BY THE PHYSICIAN TODAY AND SIGNS OR SYMPTOMS TO REPORT TO CLINIC OR PRIMARY PHYSICIAN:  Doppler studies tomorrow of both lower extremities at 9:20am.  Check in radiology at 9:00am.  After you finish in Radiology come to the clinic for blood work.  We will call you with the date and time that Dr. Lovell Sheehan can see you.  Once we have the date that the port will be placed we will get you scheduled for the CT Scans of your Abdomen and Pelvis.  Those will be our baseline studies.  We will set you up with our nurse navigator for chemo teaching.  Chemo will be gemzar and cisplatin  INSTRUCTIONS GIVEN AND DISCUSSED: Dr. Lovell Sheehan for port placement and we can possibly treat you the next day Drink 6-8 glasses of water a day  SPECIAL INSTRUCTIONS/FOLLOW-UP: Follow-up with Dr. Mariel Sleet in 3 weeks.  Thank you for choosing [facility] to provide your oncology and hematology care.  To afford each patient quality time with our providers, please arrive at least 15 minutes before your scheduled appointment time.  With your help, our goal is to use those 15 minutes to complete the necessary work-up to ensure our physicians have the information they need to help with your evaluation and healthcare recommendations.    Effective January 1st, 2014, we ask that you re-schedule your appointment with our physicians should you arrive 10 or more minutes late for your appointment.  We strive to give you quality time with our providers, and arriving late affects you and other patients whose appointments are after yours.    Again, thank you for choosing [facility].  Our hope is that these requests will decrease the amount of time that you wait before being seen by our physicians.        _____________________________________________________________  Should you have questions after your visit to [facility], please contact our office at [phone] between the hours of 8:30 a.m. and 5:00 p.m.  Voicemails left after 4:30 p.m. will not be returned until the following business day.  For prescription refill requests, have your pharmacy contact our office with your prescription refill request.

## 2012-07-23 ENCOUNTER — Encounter (HOSPITAL_BASED_OUTPATIENT_CLINIC_OR_DEPARTMENT_OTHER): Payer: Medicare HMO

## 2012-07-23 ENCOUNTER — Ambulatory Visit (HOSPITAL_COMMUNITY)
Admission: RE | Admit: 2012-07-23 | Discharge: 2012-07-23 | Disposition: A | Discharge status: Home / Self Care | Payer: Medicare HMO | Source: Ambulatory Visit | Attending: Oncology | Admitting: Oncology

## 2012-07-23 DIAGNOSIS — C221 Intrahepatic bile duct carcinoma: Secondary | ICD-10-CM

## 2012-07-23 DIAGNOSIS — R609 Edema, unspecified: Secondary | ICD-10-CM | POA: Insufficient documentation

## 2012-07-23 DIAGNOSIS — C768 Malignant neoplasm of other specified ill-defined sites: Secondary | ICD-10-CM | POA: Insufficient documentation

## 2012-07-23 DIAGNOSIS — I82819 Embolism and thrombosis of superficial veins of unspecified lower extremities: Secondary | ICD-10-CM | POA: Insufficient documentation

## 2012-07-23 LAB — COMPREHENSIVE METABOLIC PANEL
BUN: 8 mg/dL (ref 6–23)
CO2: 29 mEq/L (ref 19–32)
Chloride: 101 mEq/L (ref 96–112)
Creatinine, Ser: 0.71 mg/dL (ref 0.50–1.35)
GFR calc non Af Amer: >90 mL/min (ref >90)
Glucose, Bld: 177 mg/dL — ABNORMAL HIGH (ref 70–99)
Total Bilirubin: 0.8 mg/dL (ref 0.3–1.2)

## 2012-07-23 LAB — CBC WITH DIFFERENTIAL/PLATELET
HCT: 38.3 % — ABNORMAL LOW (ref 39.0–52.0)
Hemoglobin: 12.9 g/dL — ABNORMAL LOW (ref 13.0–17.0)
Lymphocytes Relative: 13 % (ref 12–46)
Lymphs Abs: 0.9 10*3/uL (ref 0.7–4.0)
MCHC: 33.7 g/dL (ref 30.0–36.0)
Monocytes Absolute: 0.5 10*3/uL (ref 0.1–1.0)
Monocytes Relative: 8 % (ref 3–12)
Neutro Abs: 4.9 10*3/uL (ref 1.7–7.7)
WBC: 7 10*3/uL (ref 4.0–10.5)

## 2012-07-23 LAB — CANCER ANTIGEN 19-9: CA 19-9: 210.3 U/mL — ABNORMAL HIGH (ref <35.0)

## 2012-07-23 NOTE — Patient Instructions (Addendum)
.  [facility] Discharge Instructions  RECOMMENDATIONS MADE BY THE CONSULTANT AND ANY TEST RESULTS WILL BE SENT TO YOUR REFERRING PHYSICIAN.  EXAM FINDINGS BY THE PHYSICIAN TODAY AND SIGNS OR SYMPTOMS TO REPORT TO CLINIC OR PRIMARY PHYSICIAN:  There is an old clot in the right leg that is probably at least 3-4 months in age. We will just watch you. If you get symptoms of pain, redness, or warmth let us know. We want you to start an 81 mg coated aspirin twice a day. Wait til after you talk to Dr. Lovell Sheehan tomorrow to see when he wants you to start it.   Dr. Lovell Sheehan at 930 on 07/24/2012   SPECIAL INSTRUCTIONS/FOLLOW-UP: CT scans tomorrow at 230.  Thank you for choosing [facility] to provide your oncology and hematology care.  To afford each patient quality time with our providers, please arrive at least 15 minutes before your scheduled appointment time.  With your help, our goal is to use those 15 minutes to complete the necessary work-up to ensure our physicians have the information they need to help with your evaluation and healthcare recommendations.    Effective January 1st, 2014, we ask that you re-schedule your appointment with our physicians should you arrive 10 or more minutes late for your appointment.  We strive to give you quality time with our providers, and arriving late affects you and other patients whose appointments are after yours.    Again, thank you for choosing [facility].  Our hope is that these requests will decrease the amount of time that you wait before being seen by our physicians.       _____________________________________________________________  Should you have questions after your visit to [facility], please contact our office at [phone] between the hours of 8:30 a.m. and 5:00 p.m.  Voicemails left after 4:30 p.m. will not be returned until the following business day.  For prescription refill  requests, have your pharmacy contact our office with your prescription refill request.

## 2012-07-23 NOTE — Progress Notes (Signed)
Labs drawn today for cbc/diff,cmp,ca19-9 

## 2012-07-23 NOTE — Patient Instructions (Addendum)
[facility]   CHEMOTHERAPY INSTRUCTIONS  Gemcitabine - bone marrow suppression (lowers white blood cells (fight infection), lowers red blood cells (make up your blood), lowers platelets (help blood to clot). Nausea/vomiting,fever, flu-like symptoms, rash.  Cisplatin - this medication is hard your kidneys. This is why we give you a large amount of fluids through your IV while you are here getting chemo. We also need you drinking 64 oz of fluid (preferably water/decaff fluids) 2 days prior to chemo and for up to 4-5 days after chemo. Drink more if you can. This will help keep your kidneys flushed. This can also cause acute and/or delayed nausea/vomiting. You must take your nausea meds as prescribed if you get nauseated. Do not wait until you start vomiting. This med can also cause peripheral neuropathy (numbness/tingling/burning in hands/fingers/feet/toes). Let us know if this develops so that we can monitor it and treat if necessary.   POTENTIAL SIDE EFFECTS OF TREATMENT: Increased Susceptibility to Infection, Vomiting, Constipation, Hair Thinning, Changes in Character of Skin and Nails (brittleness, dryness,etc.), Bone Marrow Suppression, Complete Hair Loss, Nausea, Diarrhea, Sun Sensitivity and Mouth Sores   EDUCATIONAL MATERIALS GIVEN AND REVIEWED: Chemotherapy and You  Specific Instructions Sheets: Gemzar, Cisplatin, Dexamethasone, Zofran, EMLA cream, Aloxi, & Emend   SELF CARE ACTIVITIES WHILE ON CHEMOTHERAPY: Increase your fluid intake 48 hours prior to treatment and drink at least 2 quarts per day after treatment., No alcohol intake., No aspirin or other medications unless approved by your oncologist., Eat foods that are light and easy to digest., Eat foods at cold or room temperature., No fried, fatty, or spicy foods immediately before or after treatment., Have teeth cleaned professionally before starting treatment. Keep dentures and partial plates clean.,  Use soft toothbrush and do not use mouthwashes that contain alcohol. Biotene is a good mouthwash that is available at most pharmacies or may be ordered by calling [phone]., Use warm salt water gargles (1 teaspoon salt per 1 quart warm water) before and after meals and at bedtime. Or you may rinse with 2 tablespoons of three -percent hydrogen peroxide mixed in eight ounces of water., Always use sunscreen with SPF (Sun Protection Factor) of 30 or higher., Use your nausea medication as directed to prevent nausea., Use your stool softener or laxative as directed to prevent constipation. and Use your anti-diarrheal medication as directed to stop diarrhea.  Please wash your hands for at least 30 seconds using warm soapy water. Handwashing is the #1 way to prevent the spread of germs. Stay away from sick people or people who are getting over a cold. If you develop respiratory systems such as green/yellow mucus production or productive cough or persistent cough let us know and we will see if you need an antibiotic. It is a good idea to keep a pair of gloves on when going into grocery stores/[employer] to decrease your risk of coming into contact with germs on the carts, etc. Carry alcohol hand gel with you at all times and use it frequently if out in public. All foods need to be cooked thoroughly. No raw foods. No medium or undercooked meats, eggs. If your food is cooked medium well, it does not need to be hot pink or saturated with bloody liquid at all. Vegetables and fruits need to be washed/rinsed under the faucet with a dish detergent before being consumed. You can eat raw fruits and vegetables unless we tell you otherwise but it would be best if you cooked  them or bought frozen. Do not eat off of salad bars or hot bars unless you really trust the cleanliness of the restaurant. If you need dental work, please let Dr. Mariel Sleet know before you go for your appointment so that we can coordinate the best possible time  for you in regards to your chemo regimen. You need to also let your dentist know that you are actively taking chemo. We may need to do labs prior to your dental appointment. We also want your bowels moving at least every other day. If this is not happening, we need to know so that we can get you on a bowel regimen to help you go.    MEDICATIONS: You have been given prescriptions for the following medications:  Dexamethasone 4mg  tablet. Starting the day after chemo, take 2 tablets in the am and 2 tablets in the pm for 2 days. Repeat with each treatment.   Zofran/Ondansetron 8mg  tablet. Starting the 3rd day after chemo, take 1 tablet three times a day IF needed for nausea/vomiting.   Ativan 1mg  tablet. Take 1/2 tablet to 1 tablet every 4 hours IF needed for nausea/vomiting. Side Effect: drowsiness/sleepiness. Do not drive while taking.   EMLA cream. Apply a quarter sized amount to port site 1 hour prior to chemo. Do not rub in. Cover with plastic.   Over-the-Counter Meds:  Colace - this is a stool softener. Take 100mg  capsule 2-6 times a day as needed. If you have to take more than 6 capsules of Colace a day call the Cancer Center.  Senna - this is a mild laxative used to treat mild constipation. May take 2 tabs by mouth daily or up to twice a day as needed for mild constipation.  Milk of Magnesia - this is a laxative used to treat moderate to severe constipation. May take 2-4 tablespoons every 8 hours as needed. May increase to 8 tablespoons x 1 dose and if no bowel movement call the Cancer Center.  Imodium - this is for diarrhea. Take 2 tabs after 1st loose stool and then 1 tab after each loose stool until you go a total of 12 hours without a loose stool. Call Cancer Center if loose stools continue.  Maalox - 1-2 tablespoons every 2 hours if needed for heartburn.     SYMPTOMS TO REPORT AS SOON AS POSSIBLE AFTER TREATMENT:  FEVER GREATER THAN 100.5 F  CHILLS WITH OR WITHOUT  FEVER  NAUSEA AND VOMITING THAT IS NOT CONTROLLED WITH YOUR NAUSEA MEDICATION  UNUSUAL SHORTNESS OF BREATH  UNUSUAL BRUISING OR BLEEDING  TENDERNESS IN MOUTH AND THROAT WITH OR WITHOUT PRESENCE OF ULCERS  URINARY PROBLEMS  BOWEL PROBLEMS  UNUSUAL RASH    Wear comfortable clothing and clothing appropriate for easy access to any Portacath or PICC line. Let us know if there is anything that we can do to make your therapy better!      I have been informed and understand all of the instructions given to me and have received a copy. I have been instructed to call the clinic [phone] or my family physician as soon as possible for continued medical care, if indicated. I do not have any more questions at this time but understand that I may call the Cancer Center or the Patient Navigator at [phone] during office hours should I have questions or need assistance in obtaining follow-up care.      _________________________________________      _______________     __________  Signature of Patient or Authorized Representative        Date                            Time      _________________________________________ [employer]

## 2012-07-24 ENCOUNTER — Ambulatory Visit (HOSPITAL_COMMUNITY)
Admission: RE | Admit: 2012-07-24 | Discharge: 2012-07-24 | Disposition: A | Discharge status: Home / Self Care | Payer: Medicare HMO | Source: Ambulatory Visit | Attending: Oncology | Admitting: Oncology

## 2012-07-24 ENCOUNTER — Encounter (HOSPITAL_COMMUNITY)
Admission: RE | Admit: 2012-07-24 | Discharge: 2012-07-24 | Disposition: A | Discharge status: Home / Self Care | Payer: Medicare HMO | Source: Ambulatory Visit | Attending: General Surgery | Admitting: General Surgery

## 2012-07-24 ENCOUNTER — Encounter (HOSPITAL_COMMUNITY): Payer: Self-pay

## 2012-07-24 ENCOUNTER — Other Ambulatory Visit (HOSPITAL_COMMUNITY): Payer: Self-pay | Admitting: Oncology

## 2012-07-24 ENCOUNTER — Encounter (HOSPITAL_COMMUNITY): Payer: Self-pay | Admitting: Pharmacy Technician

## 2012-07-24 ENCOUNTER — Encounter (HOSPITAL_BASED_OUTPATIENT_CLINIC_OR_DEPARTMENT_OTHER): Payer: Medicare HMO

## 2012-07-24 DIAGNOSIS — C221 Intrahepatic bile duct carcinoma: Secondary | ICD-10-CM

## 2012-07-24 DIAGNOSIS — C240 Malignant neoplasm of extrahepatic bile duct: Secondary | ICD-10-CM | POA: Insufficient documentation

## 2012-07-24 DIAGNOSIS — R161 Splenomegaly, not elsewhere classified: Secondary | ICD-10-CM | POA: Insufficient documentation

## 2012-07-24 DIAGNOSIS — R209 Unspecified disturbances of skin sensation: Secondary | ICD-10-CM

## 2012-07-24 DIAGNOSIS — C786 Secondary malignant neoplasm of retroperitoneum and peritoneum: Secondary | ICD-10-CM

## 2012-07-24 DIAGNOSIS — K5730 Diverticulosis of large intestine without perforation or abscess without bleeding: Secondary | ICD-10-CM | POA: Insufficient documentation

## 2012-07-24 HISTORY — DX: Hypothyroidism, unspecified: E03.9

## 2012-07-24 HISTORY — DX: Acute myocardial infarction, unspecified: I21.9

## 2012-07-24 MED ORDER — IOHEXOL 300 MG/ML  SOLN
100.0000 mL | Freq: Once | INTRAMUSCULAR | Status: AC | PRN
  Administered 2012-07-24: 100 mL via INTRAVENOUS

## 2012-07-24 NOTE — H&P (Signed)
  NTS SOAP Note  Vital Signs:  Vitals as of: 07/24/2012: Systolic 128: Diastolic 70: Heart Rate 76: Temp 98.27F: Height 106ft 2in: Weight 210Lbs 0 Ounces: BMI 26.96  BMI : 26.96 kg/m2  Subjective: This 67 Years 1 Months old Male presents for portacath placement.  Needs chemotherapy for treatment of cholangiocarcinoma.   Review of Symptoms:  Constitutional:unremarkable   Head:unremarkable    Eyes:unremarkable   Nose/Mouth/Throat:unremarkable Cardiovascular:  unremarkable   Respiratory:unremarkable   Gastrointestinal:  unremarkable   Genitourinary:unremarkable       back pain Skin:unremarkable Hematolgic/Lymphatic:unremarkable     Allergic/Immunologic:unremarkable     Past Medical History:    Reviewed   Past Medical History  Surgical History: exploratory lap, endobiliary stent placement Medical Problems: cholagiocarcinoma, hypothyroidism Allergies: metoprolol Medications: dexamethasone, lactulose, sennakot, oxycodone, trazadone   Social History:Reviewed  Social History  Preferred Language: English Race:  White Ethnicity: Not Hispanic / Latino Age: 67 Years 1 Months Marital Status:  M Alcohol:  No Recreational drug(s):  No   Smoking Status: Former smoker reviewed on 07/24/2012 Started Date: 04/23/1965 Stopped Date: 04/23/2008 Functional Status reviewed on mm/dd/yyyy ------------------------------------------------ Bathing: Normal Cooking: Normal Dressing: Normal Driving: Normal Eating: Normal Managing Meds: Normal Oral Care: Normal Shopping: Normal Toileting: Normal Transferring: Normal Walking: Normal Cognitive Status reviewed on mm/dd/yyyy ------------------------------------------------ Attention: Normal Decision Making: Normal Language: Normal Memory: Normal Motor: Normal Perception: Normal Problem Solving: Normal Visual and Spatial: Normal   Family History:  Reviewed   Family History   Maternal Grandmother:  Cancer    Objective Information: General:  Well appearing, well nourished in no distress. Heart:  RRR, no murmur Lungs:    CTA bilaterally, no wheezes, rhonchi, rales.  Breathing unlabored.  Assessment:Need for central venous access  Diagnosis &amp; Procedure Smart Code   Plan:Scheduled for portacath insertion on 07/28/12.   Patient Education:Alternative treatments to surgery were discussed with patient (and family).  Risks and benefits  of procedure were fully explained to the patient (and family) who gave informed consent. Patient/family questions were addressed.  Follow-up:Pending Surgery

## 2012-07-24 NOTE — Progress Notes (Signed)
Patient has numbness on bilat posterior forearms (from wrist to mid forearm) x 2 weeks now. Chemo teaching done and consent signed for Cisplatin and Gemzar. Med/chemo calendar given. Meds called into [facility].

## 2012-07-24 NOTE — Patient Instructions (Addendum)
Lucas Patton  07/24/2012   Your procedure is scheduled on:  Monday,April 7  Report to [facility] at [place] [facility].  Call this number if you have problems the morning of surgery: [phone]   Remember:   Do not eat food or drink liquids after midnight.   Take these medicines the morning of surgery with A SIP OF WATER: Xanax,Synthroid   Do not wear jewelry, make-up or nail polish.  Do not wear lotions, powders, or perfumes. You may wear deodorant.  Do not shave 48 hours prior to surgery. Men may shave face and neck.  Do not bring valuables to the hospital.  Contacts, dentures or bridgework may not be worn into surgery.  Leave suitcase in the car. After surgery it may be brought to your room.  For patients admitted to the hospital, checkout time is 11:00 AM the day of  discharge.   Patients discharged the day of surgery will not be allowed to drive  home.  Name and phone number of your driver: Family  Special Instructions: Shower using CHG 2 nights before surgery and the night before surgery.  If you shower the day of surgery use CHG.  Use special wash - you have one bottle of CHG for all showers.  You should use approximately 1/3 of the bottle for each shower.   Please read over the following fact sheets that you were given: Pain Booklet, Coughing and Deep Breathing, Blood Transfusion Information, Surgical Site Infection Prevention, Anesthesia Post-op Instructions and Care and Recovery After Surgery    PATIENT INSTRUCTIONS POST-ANESTHESIA  IMMEDIATELY FOLLOWING SURGERY:  Do not drive or operate machinery for the first twenty four hours after surgery.  Do not make any important decisions for twenty four hours after surgery or while taking narcotic pain medications or sedatives.  If you develop intractable nausea and vomiting or a severe headache please notify your doctor immediately.  FOLLOW-UP:  Please make an appointment with your surgeon as instructed. You do not need to follow up with  anesthesia unless specifically instructed to do so.  WOUND CARE INSTRUCTIONS (if applicable):  Keep a dry clean dressing on the anesthesia/puncture wound site if there is drainage.  Once the wound has quit draining you may leave it open to air.  Generally you should leave the bandage intact for twenty four hours unless there is drainage.  If the epidural site drains for more than 36-48 hours please call the anesthesia department.  QUESTIONS?:  Please feel free to call your physician or the hospital operator if you have any questions, and they will be happy to assist you.     Implant Implanted Port Instructions An implanted port is a central line that has a round shape and is placed under the skin. It is used for long-term IV (intravenous) access for:  Medicine.  Fluids.  Liquid nutrition, such as TPN (total parenteral nutrition).  Blood samples. Ports can be placed:  In the chest area just below the collarbone (this is the most common place.)  In the arms.  In the belly (abdomen) area.  In the legs. PARTS OF THE PORT A port has 2 main parts:  The reservoir. The reservoir is round, disc-shaped, and will be a small, raised area under your skin.  The reservoir is the part where a needle is inserted (accessed) to either give medicines or to draw blood.  The catheter. The catheter is a long, slender tube that extends from the reservoir. The catheter is placed  into a large vein.  Medicine that is inserted into the reservoir goes into the catheter and then into the vein. INSERTION OF THE PORT  The port is surgically placed in either an operating room or in a procedural area (interventional radiology).  Medicine may be given to help you relax during the procedure.  The skin where the port will be inserted is numbed (local anesthetic).  1 or 2 small cuts (incisions) will be made in the skin to insert the port.  The port can be used after it has been inserted. INCISION SITE  CARE  The incision site may have small adhesive strips on it. This helps keep the incision site closed. Sometimes, no adhesive strips are placed. Instead of adhesive strips, a special kind of surgical glue is used to keep the incision closed.  If adhesive strips were placed on the incision sites, do not take them off. They will fall off on their own.  The incision site may be sore for 1 to 2 days. Pain medicine can help.  Do not get the incision site wet. Bathe or shower as directed by your caregiver.  The incision site should heal in 5 to 7 days. A small scar may form after the incision has healed. ACCESSING THE PORT Special steps must be taken to access the port:  Before the port is accessed, a numbing cream can be placed on the skin. This helps numb the skin over the port site.  A sterile technique is used to access the port.  The port is accessed with a needle. Only "non-coring" port needles should be used to access the port. Once the port is accessed, a blood return should be checked. This helps ensure the port is in the vein and is not clogged (clotted).  If your caregiver believes your port should remain accessed, a clear (transparent) bandage will be placed over the needle site. The bandage and needle will need to be changed every week or as directed by your caregiver.  Keep the bandage covering the needle clean and dry. Do not get it wet. Follow your caregiver's instructions on how to take a shower or bath when the port is accessed.  If your port does not need to stay accessed, no bandage is needed over the port. FLUSHING THE PORT Flushing the port keeps it from getting clogged. How often the port is flushed depends on:  If a constant infusion is running. If a constant infusion is running, the port may not need to be flushed.  If intermittent medicines are given.  If the port is not being used. For intermittent medicines:  The port will need to be flushed:  After  medicines have been given.  After blood has been drawn.  As part of routine maintenance.  A port is normally flushed with:  Normal saline.  Heparin.  Follow your caregiver's advice on how often, how much, and the type of flush to use on your port. IMPORTANT PORT INFORMATION  Tell your caregiver if you are allergic to heparin.  After your port is placed, you will get a manufacturer's information card. The card has information about your port. Keep this card with you at all times.  There are many types of ports available. Know what kind of port you have.  In case of an emergency, it may be helpful to wear a medical alert bracelet. This can help alert health care workers that you have a port.  The port can stay in for  as long as your caregiver believes it is necessary.  When it is time for the port to come out, surgery will be done to remove it. The surgery will be similar to how the port was put in.  If you are in the hospital or clinic:  Your port will be taken care of and flushed by a nurse.  If you are at home:  A home health care nurse may give medicines and take care of the port.  You or a family member can get special training and directions for giving medicine and taking care of the port at home. SEEK IMMEDIATE MEDICAL CARE IF:   Your port does not flush or you are unable to get a blood return.  New drainage or pus is coming from the incision.  A bad smell is coming from the incision site.  You develop swelling or increased redness at the incision site.  You develop increased swelling or pain at the port site.  You develop swelling or pain in the surrounding skin near the port.  You have an oral temperature above 102 F (38.9 C), not controlled by medicine. MAKE SURE YOU:   Understand these instructions.  Will watch your condition.  Will get help right away if you are not doing well or get worse. Document Released: 04/09/2005 Document Revised:  07/02/2011 Document Reviewed: 07/01/2008 [facility] Patient Information 2013 [place], [place].

## 2012-07-25 ENCOUNTER — Encounter (HOSPITAL_BASED_OUTPATIENT_CLINIC_OR_DEPARTMENT_OTHER): Payer: Medicare HMO | Admitting: Oncology

## 2012-07-25 ENCOUNTER — Other Ambulatory Visit (HOSPITAL_COMMUNITY): Payer: Medicare HMO

## 2012-07-25 DIAGNOSIS — I82401 Acute embolism and thrombosis of unspecified deep veins of right lower extremity: Secondary | ICD-10-CM

## 2012-07-25 DIAGNOSIS — C221 Intrahepatic bile duct carcinoma: Secondary | ICD-10-CM

## 2012-07-25 DIAGNOSIS — I82819 Embolism and thrombosis of superficial veins of unspecified lower extremities: Secondary | ICD-10-CM

## 2012-07-25 DIAGNOSIS — C786 Secondary malignant neoplasm of retroperitoneum and peritoneum: Secondary | ICD-10-CM

## 2012-07-25 NOTE — Patient Instructions (Addendum)
[facility] Discharge Instructions  RECOMMENDATIONS MADE BY THE CONSULTANT AND ANY TEST RESULTS WILL BE SENT TO YOUR REFERRING PHYSICIAN.  EXAM FINDINGS BY THE PHYSICIAN TODAY AND SIGNS OR SYMPTOMS TO REPORT TO CLINIC OR PRIMARY PHYSICIAN: You have a blood clot in your leg that is probably old and has been there for some time.  It probably started about the same time that your cancer started.  Dr. Mariel Sleet has talked with Dr. Isaiah Serge who specializes with patients clotting problems and is it recommended that you be treated for this.  We will check with your insurance to see what they will cover and will need to get started after you get your port placed.  The type cancer you have and chemotherapy will increase your risk for blood clots so we need to get you on treatment.  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   MEDICATIONS PRESCRIBED: Apixaban 2.5 mg take 1 twice daily.  Don't start until after port is placed on Wednesday or Thursday of next week .    SPECIAL INSTRUCTIONS/FOLLOW-UP: Return as scheduled.  Thank you for choosing [facility] to provide your oncology and hematology care.  To afford each patient quality time with our providers, please arrive at least 15 minutes before your scheduled appointment time.  With your help, our goal is to use those 15 minutes to complete the necessary work-up to ensure our physicians have the information they need to help with your evaluation and healthcare recommendations.    Effective January 1st, 2014, we ask that you re-schedule your appointment with our physicians should you arrive 10 or more minutes late for your appointment.  We strive to give you quality time with our providers, and arriving late affects you and other patients whose appointments are after yours.    Again, thank you for choosing [facility].  Our hope is that these requests will decrease the amount of  time that you wait before being seen by our physicians.       _____________________________________________________________  Should you have questions after your visit to [facility], please contact our office at [phone] between the hours of 8:30 a.m. and 5:00 p.m.  Voicemails left after 4:30 p.m. will not be returned until the following business day.  For prescription refill requests, have your pharmacy contact our office with your prescription refill request.     Apixaban oral tablets What is this medicine? APIXABAN is an anticoagulant (blood thinner). It is used to lower the chance of stroke in people with a medical condition called atrial fibrillation. This medicine may be used for other purposes; ask your health care provider or pharmacist if you have questions. What should I tell my health care provider before I take this medicine? They need to know if you have any of these conditions: -bleeding disorders -bleeding in the brain -blood in your stools (black or tarry stools) or if you have blood in your vomit -history of stomach bleeding -kidney disease -liver disease -mechanical heart valve -an unusual or allergic reaction to apixaban, other medicines, foods, dyes, or preservatives -pregnant or trying to get pregnant -breast-feeding How should I use this medicine? Take this medicine by mouth with a glass of water. Follow the directions on the prescription label. You can take it with or without food. If it upsets your stomach, take it with food. Take your medicine at regular intervals. Do not take it  more often than directed. Do not stop taking except on your doctor's advice. Talk to your pediatrician regarding the use of this medicine in children. Special care may be needed. Overdosage: If you think you've taken too much of this medicine contact a poison control center or emergency room at once. Overdosage: If you think you have taken too much of this medicine  contact a poison control center or emergency room at once. NOTE: This medicine is only for you. Do not share this medicine with others. What if I miss a dose? If you miss a dose, take it as soon as you can. If it is almost time for your next dose, take only that dose. Do not take double or extra doses. What may interact with this medicine? This medicine may interact with the following: -aspirin and aspirin-like medicines -certain medicines for fungal infections like ketoconazole and itraconazole -certain medicines for seizures like carbamazepine and phenytoin -certain medicines that treat or prevent blood clots like warfarin, enoxaparin, and dalteparin -clarithromycin -NSAIDs, medicines for pain and inflammation, like ibuprofen or naproxen -rifampin -ritonavir -St. John's wort This list may not describe all possible interactions. Give your health care provider a list of all the medicines, herbs, non-prescription drugs, or dietary supplements you use. Also tell them if you smoke, drink alcohol, or use illegal drugs. Some items may interact with your medicine. What should I watch for while using this medicine? Do not stop taking this medicine without first talking to your doctor. Stopping this medicine may increase your risk of having a stroke. Be sure to refill your prescription before you run out of medicine. This medicine may increase your risk to bruise or bleed. Call your doctor or health care professional if you notice any unusual bleeding. Be careful brushing and flossing your teeth or using a toothpick because you may bleed more easily. If you have any dental work done, tell your dentist you are receiving this medicine. What side effects may I notice from receiving this medicine? Side effects that you should report to your doctor or health care professional as soon as possible: -allergic reactions like skin rash, itching or hives, swelling of the face, lips, or tongue -bloody or black,  tarry stools -changes in vision -confusion, trouble speaking or understanding -red or dark-brown urine -red spots on the skin -severe headaches -spitting up blood or brown material that looks like coffee grounds -sudden numbness or weakness of the face, arm or leg -trouble walking, dizziness, loss of balance or coordination -unusual bruising or bleeding from the eye, gums, or nose This list may not describe all possible side effects. Call your doctor for medical advice about side effects. You may report side effects to FDA at 1-800-FDA-1088. Where should I keep my medicine? Keep out of the reach of children. Store at room temperature between 20 and 25 degrees C (68 and 77 degrees F). Throw away any unused medicine after the expiration date. NOTE: This sheet is a summary. It may not cover all possible information. If you have questions about this medicine, talk to your doctor, pharmacist, or health care provider.  2013, [employer]. (04/26/2011 2:48:26 PM)

## 2012-07-25 NOTE — Progress Notes (Signed)
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 Jola Babinski at [facility]. He is to embark on chemotherapy next week after port is placed #2 clot in his right femoral vein age undetermined but has re-cannalized giving it obviously some age. I took the liberty of calling Dr. Cherie Dark at [facility] who is a coagulation specialist about him. There is no active new clot but he is at high risk for a repeat clotting issue because of his cancer diagnosis now which is stage IV, his chemotherapy which is about to start and the fact the chart he clotted sometime most likely within the last 5 months since the initial symptomatology from his cholangiocarcinoma.  The patient does not want to be on warfarin, nor does he wanted injection so we have decided to try him on apixaban 2.5 mg twice a day.  We had a long discussion over this disorder of his and the risk of recurrent clotting. He is certainly at high risk. I think this is the safest approach for this gentleman. I would be very worried about not treating him with something such as warfarin or the above since he is at such high-risk.  He and his wife are both agreeable to this. He will start this after the port is placed next week.

## 2012-07-28 ENCOUNTER — Ambulatory Visit (HOSPITAL_COMMUNITY)
Admission: RE | Admit: 2012-07-28 | Discharge: 2012-07-28 | Disposition: A | Discharge status: Home / Self Care | Payer: Medicare HMO | Source: Ambulatory Visit | Attending: General Surgery | Admitting: General Surgery

## 2012-07-28 ENCOUNTER — Encounter (HOSPITAL_COMMUNITY): Payer: Self-pay | Admitting: *Deleted

## 2012-07-28 ENCOUNTER — Ambulatory Visit (HOSPITAL_COMMUNITY): Payer: Medicare HMO | Admitting: Anesthesiology

## 2012-07-28 ENCOUNTER — Ambulatory Visit (HOSPITAL_COMMUNITY): Payer: Medicare HMO

## 2012-07-28 ENCOUNTER — Encounter (HOSPITAL_COMMUNITY)
Admission: RE | Disposition: A | Discharge status: Home / Self Care | Payer: Self-pay | Source: Ambulatory Visit | Attending: General Surgery

## 2012-07-28 ENCOUNTER — Encounter (HOSPITAL_COMMUNITY): Payer: Self-pay | Admitting: Anesthesiology

## 2012-07-28 DIAGNOSIS — I10 Essential (primary) hypertension: Secondary | ICD-10-CM | POA: Insufficient documentation

## 2012-07-28 DIAGNOSIS — C221 Intrahepatic bile duct carcinoma: Secondary | ICD-10-CM | POA: Insufficient documentation

## 2012-07-28 HISTORY — PX: PORTACATH PLACEMENT: SHX2246

## 2012-07-28 LAB — SURGICAL PCR SCREEN: MRSA, PCR: NEGATIVE

## 2012-07-28 SURGERY — INSERTION, TUNNELED CENTRAL VENOUS DEVICE, WITH PORT
Anesthesia: Monitor Anesthesia Care | Site: Chest | Laterality: Left | Wound class: Clean

## 2012-07-28 MED ORDER — FENTANYL CITRATE 0.05 MG/ML IJ SOLN
25.0000 ug | INTRAMUSCULAR | Status: DC | PRN
  Administered 2012-07-28: 25 ug via INTRAVENOUS

## 2012-07-28 MED ORDER — SCOPOLAMINE 1 MG/3DAYS TD PT72
MEDICATED_PATCH | TRANSDERMAL | Status: AC
  Filled 2012-07-28: qty 1

## 2012-07-28 MED ORDER — MIDAZOLAM HCL 2 MG/2ML IJ SOLN
1.0000 mg | INTRAMUSCULAR | Status: DC | PRN
  Administered 2012-07-28: 2 mg via INTRAVENOUS

## 2012-07-28 MED ORDER — FENTANYL CITRATE 0.05 MG/ML IJ SOLN
INTRAMUSCULAR | Status: AC
  Filled 2012-07-28: qty 2

## 2012-07-28 MED ORDER — HEPARIN SOD (PORK) LOCK FLUSH 100 UNIT/ML IV SOLN
INTRAVENOUS | Status: AC
  Filled 2012-07-28: qty 5

## 2012-07-28 MED ORDER — DEXAMETHASONE SODIUM PHOSPHATE 4 MG/ML IJ SOLN
INTRAMUSCULAR | Status: AC
  Filled 2012-07-28: qty 1

## 2012-07-28 MED ORDER — ONDANSETRON HCL 4 MG/2ML IJ SOLN
4.0000 mg | Freq: Once | INTRAMUSCULAR | Status: AC
  Administered 2012-07-28: 4 mg via INTRAVENOUS

## 2012-07-28 MED ORDER — HEPARIN SOD (PORK) LOCK FLUSH 100 UNIT/ML IV SOLN
INTRAVENOUS | Status: DC | PRN
  Administered 2012-07-28: 500 [IU] via INTRAVENOUS

## 2012-07-28 MED ORDER — LIDOCAINE HCL (PF) 1 % IJ SOLN
INTRAMUSCULAR | Status: AC
  Filled 2012-07-28: qty 5

## 2012-07-28 MED ORDER — MUPIROCIN 2 % EX OINT
TOPICAL_OINTMENT | Freq: Two times a day (BID) | CUTANEOUS | Status: DC
  Administered 2012-07-28: 1 via NASAL

## 2012-07-28 MED ORDER — CEFAZOLIN SODIUM-DEXTROSE 2-3 GM-% IV SOLR
2.0000 g | INTRAVENOUS | Status: AC
  Administered 2012-07-28: 2 g via INTRAVENOUS

## 2012-07-28 MED ORDER — PROPOFOL 10 MG/ML IV EMUL
INTRAVENOUS | Status: AC
  Filled 2012-07-28: qty 20

## 2012-07-28 MED ORDER — LIDOCAINE HCL (PF) 1 % IJ SOLN
INTRAMUSCULAR | Status: AC
  Filled 2012-07-28: qty 30

## 2012-07-28 MED ORDER — FENTANYL CITRATE 0.05 MG/ML IJ SOLN
INTRAMUSCULAR | Status: DC | PRN
  Administered 2012-07-28: 50 ug via INTRAVENOUS

## 2012-07-28 MED ORDER — KETOROLAC TROMETHAMINE 30 MG/ML IJ SOLN
INTRAMUSCULAR | Status: AC
  Filled 2012-07-28: qty 1

## 2012-07-28 MED ORDER — SCOPOLAMINE 1 MG/3DAYS TD PT72
1.0000 | MEDICATED_PATCH | TRANSDERMAL | Status: DC
  Administered 2012-07-28: 1.5 mg via TRANSDERMAL

## 2012-07-28 MED ORDER — LIDOCAINE HCL (PF) 1 % IJ SOLN
INTRAMUSCULAR | Status: DC | PRN
  Administered 2012-07-28: 9 mL

## 2012-07-28 MED ORDER — DEXAMETHASONE SODIUM PHOSPHATE 4 MG/ML IJ SOLN
4.0000 mg | Freq: Once | INTRAMUSCULAR | Status: AC
  Administered 2012-07-28: 4 mg via INTRAVENOUS

## 2012-07-28 MED ORDER — MIDAZOLAM HCL 2 MG/2ML IJ SOLN
INTRAMUSCULAR | Status: AC
  Filled 2012-07-28: qty 2

## 2012-07-28 MED ORDER — ONDANSETRON HCL 4 MG/2ML IJ SOLN
INTRAMUSCULAR | Status: AC
  Filled 2012-07-28: qty 2

## 2012-07-28 MED ORDER — PROPOFOL INFUSION 10 MG/ML OPTIME
INTRAVENOUS | Status: DC | PRN
  Administered 2012-07-28: 40 ug/kg/min via INTRAVENOUS

## 2012-07-28 MED ORDER — CHLORHEXIDINE GLUCONATE 4 % EX LIQD: 1.0000 "application " | Freq: Once | CUTANEOUS | Status: DC

## 2012-07-28 MED ORDER — KETOROLAC TROMETHAMINE 30 MG/ML IJ SOLN
30.0000 mg | Freq: Once | INTRAMUSCULAR | Status: AC
  Administered 2012-07-28: 30 mg via INTRAVENOUS

## 2012-07-28 MED ORDER — CEFAZOLIN SODIUM-DEXTROSE 2-3 GM-% IV SOLR
INTRAVENOUS | Status: AC
  Filled 2012-07-28: qty 50

## 2012-07-28 MED ORDER — MUPIROCIN 2 % EX OINT
TOPICAL_OINTMENT | CUTANEOUS | Status: AC
  Filled 2012-07-28: qty 22

## 2012-07-28 MED ORDER — LACTATED RINGERS IV SOLN
INTRAVENOUS | Status: DC
  Administered 2012-07-28: 07:00:00 via INTRAVENOUS

## 2012-07-28 MED ORDER — FENTANYL CITRATE 0.05 MG/ML IJ SOLN: 25.0000 ug | INTRAMUSCULAR | Status: DC | PRN

## 2012-07-28 MED ORDER — ONDANSETRON HCL 4 MG/2ML IJ SOLN: 4.0000 mg | Freq: Once | INTRAMUSCULAR | Status: DC | PRN

## 2012-07-28 MED ORDER — SODIUM CHLORIDE 0.9 % IV SOLN
INTRAVENOUS | Status: DC | PRN
  Administered 2012-07-28: 500 mL via INTRAMUSCULAR

## 2012-07-28 SURGICAL SUPPLY — 41 items
ADH SKN CLS APL DERMABOND .7 (GAUZE/BANDAGES/DRESSINGS) ×1
APPLIER CLIP 9.375 SM OPEN (CLIP)
APR CLP SM 9.3 20 MLT OPN (CLIP)
BAG DECANTER FOR FLEXI CONT (MISCELLANEOUS) ×2 IMPLANT
BAG HAMPER (MISCELLANEOUS) ×2 IMPLANT
CATH HICKMAN DUAL 12.0 (CATHETERS) IMPLANT
CLIP APPLIE 9.375 SM OPEN (CLIP) IMPLANT
CLOTH BEACON ORANGE TIMEOUT ST (SAFETY) ×2 IMPLANT
COVER LIGHT HANDLE STERIS (MISCELLANEOUS) ×4 IMPLANT
DECANTER SPIKE VIAL GLASS SM (MISCELLANEOUS) ×2 IMPLANT
DERMABOND ADVANCED (GAUZE/BANDAGES/DRESSINGS) ×1
DERMABOND ADVANCED .7 DNX12 (GAUZE/BANDAGES/DRESSINGS) ×1 IMPLANT
DRAPE C-ARM FOLDED MOBILE STRL (DRAPES) ×2 IMPLANT
DURAPREP 26ML APPLICATOR (WOUND CARE) ×2 IMPLANT
ELECT REM PT RETURN 9FT ADLT (ELECTROSURGICAL) ×2
ELECTRODE REM PT RTRN 9FT ADLT (ELECTROSURGICAL) ×1 IMPLANT
GLOVE BIO SURGEON STRL SZ7.5 (GLOVE) ×2 IMPLANT
GLOVE BIOGEL PI IND STRL 7.0 (GLOVE) IMPLANT
GLOVE BIOGEL PI INDICATOR 7.0 (GLOVE) ×1
GLOVE EXAM NITRILE MD LF STRL (GLOVE) ×1 IMPLANT
GLOVE SS BIOGEL STRL SZ 6.5 (GLOVE) IMPLANT
GLOVE SUPERSENSE BIOGEL SZ 6.5 (GLOVE) ×1
GOWN STRL REIN XL XLG (GOWN DISPOSABLE) ×4 IMPLANT
IV NS 500ML (IV SOLUTION) ×2
IV NS 500ML BAXH (IV SOLUTION) ×1 IMPLANT
KIT PORT POWER 8FR ISP MRI (CATHETERS) ×2 IMPLANT
KIT ROOM TURNOVER APOR (KITS) ×2 IMPLANT
MANIFOLD NEPTUNE II (INSTRUMENTS) ×2 IMPLANT
NDL HYPO 25X1 1.5 SAFETY (NEEDLE) ×1 IMPLANT
NEEDLE HYPO 25X1 1.5 SAFETY (NEEDLE) ×2 IMPLANT
PACK MINOR (CUSTOM PROCEDURE TRAY) ×2 IMPLANT
PAD ARMBOARD 7.5X6 YLW CONV (MISCELLANEOUS) ×2 IMPLANT
SET BASIN LINEN APH (SET/KITS/TRAYS/PACK) ×2 IMPLANT
SET INTRODUCER 12FR PACEMAKER (SHEATH) IMPLANT
SHEATH COOK PEEL AWAY SET 8F (SHEATH) IMPLANT
SUT PROLENE 3 0 PS 2 (SUTURE) IMPLANT
SUT VIC AB 3-0 SH 27 (SUTURE) ×2
SUT VIC AB 3-0 SH 27X BRD (SUTURE) ×1 IMPLANT
SUT VIC AB 4-0 PS2 27 (SUTURE) ×2 IMPLANT
SYR CONTROL 10ML LL (SYRINGE) ×2 IMPLANT
SYRINGE 10CC LL (SYRINGE) ×2 IMPLANT

## 2012-07-28 NOTE — Anesthesia Postprocedure Evaluation (Signed)
  Anesthesia Post-op Note  Patient: Lucas Patton  Procedure(s) Performed: Procedure(s) with comments: INSERTION PORT-A-CATH (N/A) - Insertion Port-A-Cath Left Subclavian  Patient Location: PACU  Anesthesia Type:MAC  Level of Consciousness: awake, alert  and oriented  Airway and Oxygen Therapy: Patient Spontanous Breathing  Post-op Pain: none  Post-op Assessment: Post-op Vital signs reviewed, Patient's Cardiovascular Status Stable, Respiratory Function Stable, Patent Airway and No signs of Nausea or vomiting  Post-op Vital Signs: Reviewed and stable  Complications: No apparent anesthesia complications

## 2012-07-28 NOTE — Op Note (Signed)
Patient:  Lucas Patton  DOB:  1945-12-15  MRN:  [id number]   Preop Diagnosis:  Cholangiocarcinoma, need for central venous access  Postop Diagnosis:  Same  Procedure:  Port-A-Cath insertion  Surgeon:  Franky Macho, M.D.  Anes:  MAC  Indications:  Patient is a 67 year old white male who presents for a Port-A-Cath insertion. He is about to undergo chemotherapy. The risks and benefits of the procedure including bleeding, infection, and pneumothorax were fully explained to the patient, who gave informed consent.  Procedure note:  The patient was placed in the Trendelenburg position after the left upper chest was prepped and draped using usual sterile technique with DuraPrep. Surgical site confirmation was performed. 1% Xylocaine was used for local anesthesia.  An incision was made just inferior to left clavicle. Subcutaneous pocket was then formed. A needle is advanced into left subclavian vein using the Seldinger technique without difficulty. A guidewire was then advanced through the needle under fluoroscopic guidance into the right atrium. An introducer and peel-away sheath were then placed over the guidewire. The catheter was then inserted through the peel-away sheath the peel-away sheath was removed. The catheter was then attached to the port and the port placed in its subcutaneous pocket. A power port was inserted. Adequate positioning was confirmed by fluoroscopy. Good backflow was noted in the port. The port was flushed with heparin flush. The subcutaneous layer was reapproximated using a 3-0 Vicryl interrupted suture. The skin was closed using a 4-0 Vicryl subcuticular suture. Dermabond was then applied.  All tape and needle counts were correct at the end of the procedure. The patient was transferred to PACU in stable condition. A chest x-ray will be performed at that time.  Complications:  None  EBL:  Minimal  Specimen:  None

## 2012-07-28 NOTE — Anesthesia Preprocedure Evaluation (Signed)
Anesthesia Evaluation  Patient identified by MRN, date of birth, ID band Patient awake    Reviewed: Allergy & Precautions, H&P , NPO status , Patient's Chart, lab work & pertinent test results  Airway Mallampati: I      Dental  (+) Teeth Intact   Pulmonary neg pulmonary ROS,  breath sounds clear to auscultation        Cardiovascular hypertension, Pt. on medications - angina+ CAD, + Past MI and + Cardiac Stents Rhythm:Regular Rate:Normal     Neuro/Psych    GI/Hepatic   Endo/Other  Hypothyroidism   Renal/GU      Musculoskeletal   Abdominal   Peds  Hematology   Anesthesia Other Findings   Reproductive/Obstetrics                           Anesthesia Physical Anesthesia Plan  ASA: III  Anesthesia Plan: MAC   Post-op Pain Management:    Induction: Intravenous  Airway Management Planned: Nasal Cannula  Additional Equipment:   Intra-op Plan:   Post-operative Plan:   Informed Consent: I have reviewed the patients History and Physical, chart, labs and discussed the procedure including the risks, benefits and alternatives for the proposed anesthesia with the patient or authorized representative who has indicated his/her understanding and acceptance.     Plan Discussed with:   Anesthesia Plan Comments:         Anesthesia Quick Evaluation

## 2012-07-28 NOTE — Transfer of Care (Signed)
Immediate Anesthesia Transfer of Care Note  Patient: Lucas Patton  Procedure(s) Performed: Procedure(s) with comments: INSERTION PORT-A-CATH (N/A) - Insertion Port-A-Cath Left Subclavian  Patient Location: PACU  Anesthesia Type:MAC  Level of Consciousness: awake  Airway & Oxygen Therapy: Patient Spontanous Breathing  Post-op Assessment: Report given to PACU RN  Post vital signs: Reviewed and stable  Complications: No apparent anesthesia complications

## 2012-07-28 NOTE — Interval H&P Note (Signed)
History and Physical Interval Note:  07/28/2012 7:28 AM  Hooper L Mowrer  has presented today for surgery, with the diagnosis of cholangio carcinoma  The various methods of treatment have been discussed with the patient and family. After consideration of risks, benefits and other options for treatment, the patient has consented to  Procedure(s): INSERTION PORT-A-CATH (N/A) as a surgical intervention .  The patient's history has been reviewed, patient examined, no change in status, stable for surgery.  I have reviewed the patient's chart and labs.  Questions were answered to the patient's satisfaction.     Franky Macho A

## 2012-07-29 ENCOUNTER — Encounter (HOSPITAL_BASED_OUTPATIENT_CLINIC_OR_DEPARTMENT_OTHER): Payer: Medicare HMO

## 2012-07-29 VITALS — BP 114/59 | HR 71 | Temp 97.9°F | Resp 18

## 2012-07-29 DIAGNOSIS — C786 Secondary malignant neoplasm of retroperitoneum and peritoneum: Secondary | ICD-10-CM

## 2012-07-29 DIAGNOSIS — Z5111 Encounter for antineoplastic chemotherapy: Secondary | ICD-10-CM

## 2012-07-29 DIAGNOSIS — C221 Intrahepatic bile duct carcinoma: Secondary | ICD-10-CM

## 2012-07-29 LAB — CBC WITH DIFFERENTIAL/PLATELET
Basophils Absolute: 0 10*3/uL (ref 0.0–0.1)
Basophils Relative: 0 % (ref 0–1)
MCHC: 34.3 g/dL (ref 30.0–36.0)
Monocytes Absolute: 0.7 10*3/uL (ref 0.1–1.0)
Neutro Abs: 6.4 10*3/uL (ref 1.7–7.7)
Neutrophils Relative %: 79 % — ABNORMAL HIGH (ref 43–77)
RDW: 14.7 % (ref 11.5–15.5)

## 2012-07-29 LAB — COMPREHENSIVE METABOLIC PANEL
AST: 50 U/L — ABNORMAL HIGH (ref 0–37)
Albumin: 2.6 g/dL — ABNORMAL LOW (ref 3.5–5.2)
Chloride: 101 mEq/L (ref 96–112)
Creatinine, Ser: 0.74 mg/dL (ref 0.50–1.35)
Potassium: 3.3 mEq/L — ABNORMAL LOW (ref 3.5–5.1)
Total Bilirubin: 0.7 mg/dL (ref 0.3–1.2)

## 2012-07-29 LAB — MAGNESIUM: Magnesium: 1.7 mg/dL (ref 1.5–2.5)

## 2012-07-29 MED ORDER — SODIUM CHLORIDE 0.9 % IV SOLN
1000.0000 mg/m2 | Freq: Once | INTRAVENOUS | Status: AC
  Administered 2012-07-29: 2204 mg via INTRAVENOUS
  Filled 2012-07-29: qty 58

## 2012-07-29 MED ORDER — SODIUM CHLORIDE 0.9 % IV SOLN
25.0000 mg/m2 | Freq: Once | INTRAVENOUS | Status: AC
  Administered 2012-07-29: 56 mg via INTRAVENOUS
  Filled 2012-07-29: qty 56

## 2012-07-29 MED ORDER — SODIUM CHLORIDE 0.9 % IV SOLN
Freq: Once | INTRAVENOUS | Status: AC
  Administered 2012-07-29: 11:00:00 via INTRAVENOUS
  Filled 2012-07-29: qty 5

## 2012-07-29 MED ORDER — PALONOSETRON HCL INJECTION 0.25 MG/5ML
0.2500 mg | Freq: Once | INTRAVENOUS | Status: AC
  Administered 2012-07-29: 0.25 mg via INTRAVENOUS

## 2012-07-29 MED ORDER — SODIUM CHLORIDE 0.9 % IV SOLN
Freq: Once | INTRAVENOUS | Status: AC
  Administered 2012-07-29: 10:00:00 via INTRAVENOUS

## 2012-07-29 MED ORDER — SODIUM CHLORIDE 0.9 % IJ SOLN
10.0000 mL | INTRAMUSCULAR | Status: DC | PRN
  Filled 2012-07-29: qty 10

## 2012-07-29 MED ORDER — SODIUM CHLORIDE 0.9 % IV SOLN: 150.0000 mg | Freq: Once | INTRAVENOUS | Status: DC

## 2012-07-29 MED ORDER — HEPARIN SOD (PORK) LOCK FLUSH 100 UNIT/ML IV SOLN
INTRAVENOUS | Status: AC
  Filled 2012-07-29: qty 5

## 2012-07-29 MED ORDER — DEXAMETHASONE SODIUM PHOSPHATE 4 MG/ML IJ SOLN: 12.0000 mg | Freq: Once | INTRAMUSCULAR | Status: DC

## 2012-07-29 MED ORDER — PALONOSETRON HCL INJECTION 0.25 MG/5ML
INTRAVENOUS | Status: AC
  Filled 2012-07-29: qty 5

## 2012-07-29 MED ORDER — HEPARIN SOD (PORK) LOCK FLUSH 100 UNIT/ML IV SOLN
500.0000 [IU] | Freq: Once | INTRAVENOUS | Status: DC | PRN
  Filled 2012-07-29: qty 5

## 2012-07-29 NOTE — Progress Notes (Signed)
Patient tolerated chemo well. 

## 2012-07-30 ENCOUNTER — Telehealth (HOSPITAL_COMMUNITY): Payer: Self-pay

## 2012-07-30 NOTE — Telephone Encounter (Signed)
Wife stated "did well.  Hasn't been sick at all"  She did think he acted as if he had limited energy and seemed to be tired.

## 2012-07-31 ENCOUNTER — Encounter (HOSPITAL_COMMUNITY): Payer: Self-pay | Admitting: General Surgery

## 2012-08-04 ENCOUNTER — Telehealth (HOSPITAL_COMMUNITY): Payer: Self-pay | Admitting: *Deleted

## 2012-08-04 NOTE — Telephone Encounter (Signed)
Patient to start Prilosec 20mg  bid per Dr. Thornton Papas orders. Called into [facility].

## 2012-08-04 NOTE — Telephone Encounter (Signed)
Patient doing pretty good since chemo. He is however having a lot of bloating, full feeling in stomach, and burping per patient's wife. He is taking Gas X but states that it is not helping much at all.Patient still complaining of a lot of lower back pain that is worse when he is sitting. Patient's bowels are moving daily. Patient's wife trying to get patient to drink plenty of fluids.

## 2012-08-05 ENCOUNTER — Other Ambulatory Visit (HOSPITAL_COMMUNITY): Payer: Self-pay | Admitting: Oncology

## 2012-08-05 ENCOUNTER — Encounter (HOSPITAL_COMMUNITY): Payer: Medicare HMO

## 2012-08-05 DIAGNOSIS — C221 Intrahepatic bile duct carcinoma: Secondary | ICD-10-CM

## 2012-08-05 LAB — COMPREHENSIVE METABOLIC PANEL
Alkaline Phosphatase: 200 U/L — ABNORMAL HIGH (ref 39–117)
BUN: 7 mg/dL (ref 6–23)
CO2: 26 mEq/L (ref 19–32)
Chloride: 96 mEq/L (ref 96–112)
Creatinine, Ser: 0.62 mg/dL (ref 0.50–1.35)
GFR calc Af Amer: >90 mL/min (ref >90)
GFR calc non Af Amer: >90 mL/min (ref >90)
Glucose, Bld: 169 mg/dL — ABNORMAL HIGH (ref 70–99)
Total Bilirubin: 0.6 mg/dL (ref 0.3–1.2)

## 2012-08-05 LAB — CBC WITH DIFFERENTIAL/PLATELET
Basophils Relative: 2 % — ABNORMAL HIGH (ref 0–1)
HCT: 31.7 % — ABNORMAL LOW (ref 39.0–52.0)
Hemoglobin: 11 g/dL — ABNORMAL LOW (ref 13.0–17.0)
Lymphocytes Relative: 23 % (ref 12–46)
Lymphs Abs: 0.5 10*3/uL — ABNORMAL LOW (ref 0.7–4.0)
MCHC: 34.7 g/dL (ref 30.0–36.0)
Monocytes Absolute: 0 10*3/uL — ABNORMAL LOW (ref 0.1–1.0)
Monocytes Relative: 0 % — ABNORMAL LOW (ref 3–12)
Neutro Abs: 1.7 10*3/uL (ref 1.7–7.7)
RBC: 3.57 MIL/uL — ABNORMAL LOW (ref 4.22–5.81)

## 2012-08-05 MED ORDER — HEPARIN SOD (PORK) LOCK FLUSH 100 UNIT/ML IV SOLN
INTRAVENOUS | Status: AC
  Filled 2012-08-05: qty 5

## 2012-08-05 MED ORDER — SODIUM CHLORIDE 0.9 % IV SOLN
INTRAVENOUS | Status: DC
  Administered 2012-08-05: 09:00:00 via INTRAVENOUS

## 2012-08-05 MED ORDER — HEPARIN SOD (PORK) LOCK FLUSH 100 UNIT/ML IV SOLN
500.0000 [IU] | Freq: Once | INTRAVENOUS | Status: AC
  Administered 2012-08-05: 500 [IU] via INTRAVENOUS
  Filled 2012-08-05: qty 5

## 2012-08-05 NOTE — Progress Notes (Signed)
Patient chemotherapy held due to labs, informed to schedule appointment for Thursday for repeat labs and possible treatment.

## 2012-08-06 ENCOUNTER — Ambulatory Visit (HOSPITAL_COMMUNITY): Payer: Medicare HMO

## 2012-08-07 ENCOUNTER — Other Ambulatory Visit (HOSPITAL_COMMUNITY): Payer: Self-pay | Admitting: Oncology

## 2012-08-07 ENCOUNTER — Encounter (HOSPITAL_BASED_OUTPATIENT_CLINIC_OR_DEPARTMENT_OTHER): Payer: Medicare HMO

## 2012-08-07 ENCOUNTER — Ambulatory Visit (HOSPITAL_COMMUNITY)
Admission: RE | Admit: 2012-08-07 | Discharge: 2012-08-07 | Disposition: A | Discharge status: Home / Self Care | Payer: Medicare HMO | Source: Ambulatory Visit | Attending: Oncology | Admitting: Oncology

## 2012-08-07 ENCOUNTER — Encounter (HOSPITAL_BASED_OUTPATIENT_CLINIC_OR_DEPARTMENT_OTHER): Payer: Medicare HMO | Admitting: Oncology

## 2012-08-07 DIAGNOSIS — R05 Cough: Secondary | ICD-10-CM

## 2012-08-07 DIAGNOSIS — D696 Thrombocytopenia, unspecified: Secondary | ICD-10-CM

## 2012-08-07 DIAGNOSIS — C221 Intrahepatic bile duct carcinoma: Secondary | ICD-10-CM

## 2012-08-07 DIAGNOSIS — R059 Cough, unspecified: Secondary | ICD-10-CM

## 2012-08-07 DIAGNOSIS — R509 Fever, unspecified: Secondary | ICD-10-CM

## 2012-08-07 DIAGNOSIS — R748 Abnormal levels of other serum enzymes: Secondary | ICD-10-CM

## 2012-08-07 DIAGNOSIS — R918 Other nonspecific abnormal finding of lung field: Secondary | ICD-10-CM

## 2012-08-07 DIAGNOSIS — R110 Nausea: Secondary | ICD-10-CM

## 2012-08-07 LAB — COMPREHENSIVE METABOLIC PANEL
ALT: 111 U/L — ABNORMAL HIGH (ref 0–53)
CO2: 26 mEq/L (ref 19–32)
Calcium: 8.7 mg/dL (ref 8.4–10.5)
Creatinine, Ser: 1.02 mg/dL (ref 0.50–1.35)
GFR calc Af Amer: 86 mL/min — ABNORMAL LOW (ref >90)
GFR calc non Af Amer: 74 mL/min — ABNORMAL LOW (ref >90)
Glucose, Bld: 113 mg/dL — ABNORMAL HIGH (ref 70–99)
Sodium: 130 mEq/L — ABNORMAL LOW (ref 135–145)
Total Protein: 6.4 g/dL (ref 6.0–8.3)

## 2012-08-07 LAB — URINE MICROSCOPIC-ADD ON

## 2012-08-07 LAB — CBC WITH DIFFERENTIAL/PLATELET
Basophils Absolute: 0 10*3/uL (ref 0.0–0.1)
Eosinophils Absolute: 0 10*3/uL (ref 0.0–0.7)
Lymphocytes Relative: 4 % — ABNORMAL LOW (ref 12–46)
Lymphs Abs: 0.4 10*3/uL — ABNORMAL LOW (ref 0.7–4.0)
Neutrophils Relative %: 91 % — ABNORMAL HIGH (ref 43–77)
Platelets: 45 10*3/uL — ABNORMAL LOW (ref 150–400)
RBC: 3.89 MIL/uL — ABNORMAL LOW (ref 4.22–5.81)
RDW: 14.6 % (ref 11.5–15.5)
Smear Review: DECREASED
WBC: 8.7 10*3/uL (ref 4.0–10.5)

## 2012-08-07 LAB — URINALYSIS, ROUTINE W REFLEX MICROSCOPIC
Hgb urine dipstick: NEGATIVE
Leukocytes, UA: NEGATIVE
Nitrite: NEGATIVE
Specific Gravity, Urine: 1.01 (ref 1.005–1.030)
Urobilinogen, UA: 0.2 mg/dL (ref 0.0–1.0)

## 2012-08-07 LAB — FOLATE: Folate: 12.5 ng/mL

## 2012-08-07 MED ORDER — LEVOFLOXACIN 750 MG PO TABS: 750.0000 mg | ORAL_TABLET | Freq: Every day | ORAL | Status: DC

## 2012-08-07 MED ORDER — SODIUM CHLORIDE 0.9 % IV SOLN
INTRAVENOUS | Status: DC
  Administered 2012-08-07: 10:00:00 via INTRAVENOUS

## 2012-08-07 MED ORDER — HEPARIN SOD (PORK) LOCK FLUSH 100 UNIT/ML IV SOLN
500.0000 [IU] | Freq: Once | INTRAVENOUS | Status: AC
  Administered 2012-08-07: 500 [IU] via INTRAVENOUS
  Filled 2012-08-07: qty 5

## 2012-08-07 MED ORDER — HEPARIN SOD (PORK) LOCK FLUSH 100 UNIT/ML IV SOLN
INTRAVENOUS | Status: AC
  Filled 2012-08-07: qty 5

## 2012-08-07 NOTE — Progress Notes (Signed)
Patient seen as a work in today. He was scheduled for day 8 of cycle 1 of chemotherapy but this will be deferred x 7 days due to thrombocytopenia.   His wife reports that he had fever of 101F last night. He also shaking chills. He does admit to a cough that is nonproductive. He denies any urinary pain, burning, frequency or hematuria. He denies any suprapubic pain.  We do not document a fever here in the clinic today but he recently did take Tylenol. As a result, we were unable to replicate a fever here in the clinic today. Blood cultures were also drawn and are pending. Otherwise, laboratory work is unimpressive except for an elevation in alkaline phosphatase. Total bilirubin is within normal limits. WBC is unremarkable.  He was given 500 cc to 1 L of normal saline in clinic today. He was also prescribed Levaquin 750 mg daily for 7 days.    The patient was educated regarding recurrent fevers greater than 100.22F and shaking chills. If he experiences this tomorrow or over the weekend, the patient was strongly urged to report to the emergency department for further workup and evaluation and possible admission to the hospital if necessary.  He does not look acutely ill today. His lungs are clear to auscultation. Heart is regular rhythm and rate. He does have bilateral lower extremity 1+ to 2+ pitting edema. He appears sallow but not jaundiced with white sclera.  All questions were answered. The patient knows to call the clinic with any problems, questions, or concerns.  Patient and plan will be discussed with Dr. Mariel Sleet within the next 24 hours.   Tyishia Aune

## 2012-08-07 NOTE — Progress Notes (Signed)
Labs drawn today for cbc/diff,methymalonic acid,b12,folate,cmp

## 2012-08-07 NOTE — Progress Notes (Signed)
Chemotherapy held due to [facility].  Patient c/o fever, cough, shaking chills, and vomiting last night.  Complaints reported to [place], [place].  Orders received and carried out per labs and MAR.

## 2012-08-08 ENCOUNTER — Telehealth (HOSPITAL_COMMUNITY): Payer: Self-pay | Admitting: Emergency Medicine

## 2012-08-08 ENCOUNTER — Ambulatory Visit (HOSPITAL_COMMUNITY): Payer: Medicare HMO

## 2012-08-08 ENCOUNTER — Other Ambulatory Visit (HOSPITAL_COMMUNITY): Payer: Self-pay | Admitting: Oncology

## 2012-08-08 ENCOUNTER — Telehealth (HOSPITAL_COMMUNITY): Payer: Self-pay | Admitting: Oncology

## 2012-08-08 DIAGNOSIS — R509 Fever, unspecified: Secondary | ICD-10-CM

## 2012-08-08 LAB — METHYLMALONIC ACID, SERUM: Methylmalonic Acid, Quantitative: 0.3 umol/L (ref <0.40)

## 2012-08-08 MED ORDER — CEPHALEXIN 500 MG PO CAPS: 500.0000 mg | ORAL_CAPSULE | Freq: Four times a day (QID) | ORAL | Status: DC

## 2012-08-08 NOTE — Telephone Encounter (Signed)
Samuella Bruin, PA-C spoke w/ S. Margo Aye, [facility] and based on preliminary BC, patient's abx was switched to Keflex 500mg  PO QID x 7 days, hold Levaquin, report to ER for fevers/chills/n/v/d, etc.  Patient was contacted and advised of same and verbalized understanding.  Keflex called in to [facility] pharmacy as requested by patient.

## 2012-08-08 NOTE — Telephone Encounter (Signed)
Received faxed communication from ER that patient's preliminary blood culture report showed gram + rods - spoke with Bennie in pharmacy to confirm that Levaquin will cover.  Contacted patient to ensure he filled and was taking his levaquin - patient reports taking levaquin and not missing doses; also reports feeling much better from yesterday.  Questions answered at this time.

## 2012-08-10 LAB — CULTURE, BLOOD (ROUTINE X 2)

## 2012-08-11 ENCOUNTER — Inpatient Hospital Stay (HOSPITAL_COMMUNITY): Payer: Medicare HMO

## 2012-08-11 ENCOUNTER — Encounter (HOSPITAL_COMMUNITY): Payer: Self-pay | Admitting: *Deleted

## 2012-08-11 ENCOUNTER — Encounter (HOSPITAL_COMMUNITY): Payer: Self-pay | Admitting: Oncology

## 2012-08-11 ENCOUNTER — Observation Stay (HOSPITAL_COMMUNITY): Payer: Medicare HMO

## 2012-08-11 ENCOUNTER — Inpatient Hospital Stay (HOSPITAL_COMMUNITY)
Admission: AD | Admit: 2012-08-11 | Discharge: 2012-08-14 | DRG: 871 | Disposition: A | Discharge status: Home Health | Payer: Medicare HMO | Source: Ambulatory Visit | Attending: Internal Medicine | Admitting: Internal Medicine

## 2012-08-11 ENCOUNTER — Encounter (HOSPITAL_COMMUNITY): Payer: Medicare HMO | Admitting: Oncology

## 2012-08-11 ENCOUNTER — Telehealth (HOSPITAL_COMMUNITY): Payer: Self-pay | Admitting: Oncology

## 2012-08-11 VITALS — BP 118/60 | HR 90 | Temp 98.8°F | Resp 18 | Wt 201.8 lb

## 2012-08-11 DIAGNOSIS — Z79899 Other long term (current) drug therapy: Secondary | ICD-10-CM

## 2012-08-11 DIAGNOSIS — Z86718 Personal history of other venous thrombosis and embolism: Secondary | ICD-10-CM

## 2012-08-11 DIAGNOSIS — R634 Abnormal weight loss: Secondary | ICD-10-CM | POA: Diagnosis present

## 2012-08-11 DIAGNOSIS — E43 Unspecified severe protein-calorie malnutrition: Secondary | ICD-10-CM | POA: Diagnosis present

## 2012-08-11 DIAGNOSIS — J189 Pneumonia, unspecified organism: Secondary | ICD-10-CM | POA: Diagnosis present

## 2012-08-11 DIAGNOSIS — I82409 Acute embolism and thrombosis of unspecified deep veins of unspecified lower extremity: Secondary | ICD-10-CM

## 2012-08-11 DIAGNOSIS — C221 Intrahepatic bile duct carcinoma: Secondary | ICD-10-CM

## 2012-08-11 DIAGNOSIS — E871 Hypo-osmolality and hyponatremia: Secondary | ICD-10-CM | POA: Diagnosis present

## 2012-08-11 DIAGNOSIS — I2510 Atherosclerotic heart disease of native coronary artery without angina pectoris: Secondary | ICD-10-CM

## 2012-08-11 DIAGNOSIS — E039 Hypothyroidism, unspecified: Secondary | ICD-10-CM

## 2012-08-11 DIAGNOSIS — A4189 Other specified sepsis: Secondary | ICD-10-CM

## 2012-08-11 DIAGNOSIS — K8020 Calculus of gallbladder without cholecystitis without obstruction: Secondary | ICD-10-CM

## 2012-08-11 DIAGNOSIS — Z66 Do not resuscitate: Secondary | ICD-10-CM | POA: Diagnosis present

## 2012-08-11 DIAGNOSIS — R7881 Bacteremia: Secondary | ICD-10-CM

## 2012-08-11 DIAGNOSIS — I10 Essential (primary) hypertension: Secondary | ICD-10-CM

## 2012-08-11 DIAGNOSIS — A409 Streptococcal sepsis, unspecified: Principal | ICD-10-CM | POA: Diagnosis present

## 2012-08-11 DIAGNOSIS — A4181 Sepsis due to Enterococcus: Secondary | ICD-10-CM

## 2012-08-11 DIAGNOSIS — Z9221 Personal history of antineoplastic chemotherapy: Secondary | ICD-10-CM

## 2012-08-11 DIAGNOSIS — K5900 Constipation, unspecified: Secondary | ICD-10-CM | POA: Diagnosis present

## 2012-08-11 DIAGNOSIS — C786 Secondary malignant neoplasm of retroperitoneum and peritoneum: Secondary | ICD-10-CM | POA: Diagnosis present

## 2012-08-11 DIAGNOSIS — I252 Old myocardial infarction: Secondary | ICD-10-CM

## 2012-08-11 DIAGNOSIS — Z7982 Long term (current) use of aspirin: Secondary | ICD-10-CM

## 2012-08-11 DIAGNOSIS — Z87891 Personal history of nicotine dependence: Secondary | ICD-10-CM

## 2012-08-11 DIAGNOSIS — K8309 Other cholangitis: Secondary | ICD-10-CM | POA: Diagnosis present

## 2012-08-11 DIAGNOSIS — E785 Hyperlipidemia, unspecified: Secondary | ICD-10-CM

## 2012-08-11 DIAGNOSIS — Z6825 Body mass index (BMI) 25.0-25.9, adult: Secondary | ICD-10-CM

## 2012-08-11 DIAGNOSIS — E7800 Pure hypercholesterolemia, unspecified: Secondary | ICD-10-CM | POA: Diagnosis present

## 2012-08-11 DIAGNOSIS — L408 Other psoriasis: Secondary | ICD-10-CM | POA: Diagnosis present

## 2012-08-11 DIAGNOSIS — L409 Psoriasis, unspecified: Secondary | ICD-10-CM

## 2012-08-11 DIAGNOSIS — Z9861 Coronary angioplasty status: Secondary | ICD-10-CM

## 2012-08-11 DIAGNOSIS — K831 Obstruction of bile duct: Secondary | ICD-10-CM

## 2012-08-11 DIAGNOSIS — A491 Streptococcal infection, unspecified site: Secondary | ICD-10-CM

## 2012-08-11 HISTORY — DX: Malignant (primary) neoplasm, unspecified: C80.1

## 2012-08-11 LAB — COMPREHENSIVE METABOLIC PANEL
ALT: 92 U/L — ABNORMAL HIGH (ref 0–53)
AST: 123 U/L — ABNORMAL HIGH (ref 0–37)
Alkaline Phosphatase: 265 U/L — ABNORMAL HIGH (ref 39–117)
CO2: 26 mEq/L (ref 19–32)
Chloride: 92 mEq/L — ABNORMAL LOW (ref 96–112)
GFR calc Af Amer: >90 mL/min (ref >90)
GFR calc non Af Amer: >90 mL/min (ref >90)
Glucose, Bld: 108 mg/dL — ABNORMAL HIGH (ref 70–99)
Sodium: 127 mEq/L — ABNORMAL LOW (ref 135–145)
Total Bilirubin: 3.1 mg/dL — ABNORMAL HIGH (ref 0.3–1.2)

## 2012-08-11 LAB — CBC WITH DIFFERENTIAL/PLATELET
Eosinophils Relative: 1 % (ref 0–5)
HCT: 30.7 % — ABNORMAL LOW (ref 39.0–52.0)
Lymphocytes Relative: 15 % (ref 12–46)
Lymphs Abs: 0.8 10*3/uL (ref 0.7–4.0)
MCV: 87 fL (ref 78.0–100.0)
Platelets: 113 10*3/uL — ABNORMAL LOW (ref 150–400)
RBC: 3.53 MIL/uL — ABNORMAL LOW (ref 4.22–5.81)
WBC: 5.2 10*3/uL (ref 4.0–10.5)

## 2012-08-11 MED ORDER — IOHEXOL 300 MG/ML  SOLN
50.0000 mL | INTRAMUSCULAR | Status: AC
  Administered 2012-08-11: 50 mL via ORAL

## 2012-08-11 MED ORDER — ADULT MULTIVITAMIN W/MINERALS CH
1.0000 | ORAL_TABLET | Freq: Every day | ORAL | Status: DC
  Administered 2012-08-11 – 2012-08-14 (×3): 1 via ORAL
  Filled 2012-08-11 (×3): qty 1

## 2012-08-11 MED ORDER — IOHEXOL 300 MG/ML  SOLN
100.0000 mL | Freq: Once | INTRAMUSCULAR | Status: AC | PRN
  Administered 2012-08-11: 100 mL via INTRAVENOUS

## 2012-08-11 MED ORDER — LACTULOSE 10 GM/15ML PO SOLN
20.0000 g | Freq: Every day | ORAL | Status: DC
  Administered 2012-08-12: 20 g via ORAL
  Filled 2012-08-11 (×2): qty 30

## 2012-08-11 MED ORDER — PANTOPRAZOLE SODIUM 40 MG PO TBEC
40.0000 mg | DELAYED_RELEASE_TABLET | Freq: Every day | ORAL | Status: DC
  Administered 2012-08-11 – 2012-08-14 (×3): 40 mg via ORAL
  Filled 2012-08-11 (×3): qty 1

## 2012-08-11 MED ORDER — HYDROCERIN EX CREA
TOPICAL_CREAM | Freq: Two times a day (BID) | CUTANEOUS | Status: DC
  Administered 2012-08-11 – 2012-08-14 (×5): via TOPICAL
  Filled 2012-08-11: qty 113

## 2012-08-11 MED ORDER — ASPIRIN EC 81 MG PO TBEC
81.0000 mg | DELAYED_RELEASE_TABLET | Freq: Two times a day (BID) | ORAL | Status: DC
  Administered 2012-08-11 – 2012-08-14 (×5): 81 mg via ORAL
  Filled 2012-08-11 (×6): qty 1

## 2012-08-11 MED ORDER — ACETAMINOPHEN 325 MG PO TABS
650.0000 mg | ORAL_TABLET | Freq: Four times a day (QID) | ORAL | Status: DC | PRN
  Administered 2012-08-11: 650 mg via ORAL
  Filled 2012-08-11: qty 2

## 2012-08-11 MED ORDER — OXYCODONE HCL 5 MG PO TABS
5.0000 mg | ORAL_TABLET | ORAL | Status: DC | PRN
  Administered 2012-08-11 – 2012-08-13 (×4): 5 mg via ORAL
  Filled 2012-08-11 (×4): qty 1

## 2012-08-11 MED ORDER — SODIUM CHLORIDE 0.9 % IV SOLN
2.0000 g | INTRAVENOUS | Status: DC
  Administered 2012-08-11 – 2012-08-14 (×14): 2 g via INTRAVENOUS
  Filled 2012-08-11 (×26): qty 2000

## 2012-08-11 MED ORDER — OMEGA-3-ACID ETHYL ESTERS 1 G PO CAPS
2.0000 g | ORAL_CAPSULE | Freq: Two times a day (BID) | ORAL | Status: DC
  Administered 2012-08-11 – 2012-08-14 (×6): 2 g via ORAL
  Filled 2012-08-11 (×6): qty 2

## 2012-08-11 MED ORDER — SODIUM CHLORIDE 0.9 % IV SOLN
INTRAVENOUS | Status: DC
  Administered 2012-08-11 – 2012-08-14 (×3): via INTRAVENOUS

## 2012-08-11 MED ORDER — APIXABAN 2.5 MG PO TABS
2.5000 mg | ORAL_TABLET | Freq: Two times a day (BID) | ORAL | Status: DC
  Administered 2012-08-12 – 2012-08-14 (×4): 2.5 mg via ORAL
  Filled 2012-08-11 (×7): qty 1

## 2012-08-11 MED ORDER — ALUM & MAG HYDROXIDE-SIMETH 200-200-20 MG/5ML PO SUSP
30.0000 mL | Freq: Four times a day (QID) | ORAL | Status: DC | PRN
  Administered 2012-08-11: 30 mL via ORAL
  Filled 2012-08-11: qty 30

## 2012-08-11 MED ORDER — TRAZODONE HCL 50 MG PO TABS
100.0000 mg | ORAL_TABLET | Freq: Every day | ORAL | Status: DC
  Administered 2012-08-11 – 2012-08-13 (×3): 100 mg via ORAL
  Filled 2012-08-11 (×3): qty 2

## 2012-08-11 MED ORDER — MORPHINE SULFATE 2 MG/ML IJ SOLN
2.0000 mg | INTRAMUSCULAR | Status: DC | PRN
  Administered 2012-08-14: 2 mg via INTRAVENOUS
  Filled 2012-08-11: qty 1

## 2012-08-11 MED ORDER — LEVOTHYROXINE SODIUM 75 MCG PO TABS
175.0000 ug | ORAL_TABLET | Freq: Every day | ORAL | Status: DC
  Administered 2012-08-12: 175 ug via ORAL
  Filled 2012-08-11: qty 1

## 2012-08-11 MED ORDER — LORAZEPAM 0.5 MG PO TABS: 0.5000 mg | ORAL_TABLET | ORAL | Status: DC | PRN

## 2012-08-11 MED ORDER — ENSURE PUDDING PO PUDG
1.0000 | Freq: Three times a day (TID) | ORAL | Status: DC
  Administered 2012-08-12 – 2012-08-13 (×3): 1 via ORAL

## 2012-08-11 MED ORDER — ONDANSETRON HCL 4 MG/2ML IJ SOLN
4.0000 mg | Freq: Four times a day (QID) | INTRAMUSCULAR | Status: DC | PRN
  Administered 2012-08-13 – 2012-08-14 (×2): 4 mg via INTRAVENOUS
  Filled 2012-08-11 (×2): qty 2

## 2012-08-11 MED ORDER — ACETAMINOPHEN 650 MG RE SUPP: 650.0000 mg | Freq: Four times a day (QID) | RECTAL | Status: DC | PRN

## 2012-08-11 MED ORDER — GENTAMICIN SULFATE 40 MG/ML IJ SOLN
90.0000 mg | Freq: Three times a day (TID) | INTRAVENOUS | Status: DC
  Administered 2012-08-11 – 2012-08-14 (×9): 90 mg via INTRAVENOUS
  Filled 2012-08-11 (×12): qty 2.25

## 2012-08-11 MED ORDER — SENNA 8.6 MG PO TABS
1.0000 | ORAL_TABLET | Freq: Every day | ORAL | Status: DC
  Administered 2012-08-11 – 2012-08-14 (×3): 8.6 mg via ORAL
  Filled 2012-08-11 (×3): qty 1

## 2012-08-11 MED ORDER — SODIUM CHLORIDE 0.9 % IV SOLN
1.0000 g | Freq: Four times a day (QID) | INTRAVENOUS | Status: DC
  Filled 2012-08-11 (×4): qty 1000

## 2012-08-11 MED ORDER — ONDANSETRON HCL 4 MG PO TABS: 4.0000 mg | ORAL_TABLET | Freq: Four times a day (QID) | ORAL | Status: DC | PRN

## 2012-08-11 NOTE — Progress Notes (Signed)
The patient has stage IV cholangiocarcinoma with peritoneal metastases. He therefore was nonoperative candidate. He had a stent placed that was metallic at [facility] sometime in March. 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 I brought him here today to get him admitted for definitive intravenous antibiotic therapy.  I spoke with Dr. Luciana Axe from infectious diseases as well as Dr. Karilyn Cota as well as Dr. Lendell Caprice who is a hospitalist on duty. She has agreed to admit him. We do want to pursue a CAT scan of his abdomen and then Dr. Karilyn Cota will discuss his case with the gastroenterologist at [facility] who placed a stent.  We are worried that the stent is contaminated and may not be clear to this infection.  This is going to interfere with his ability to be treated with chemotherapy. We are he experienced a delay after day one of chemotherapy, cycle 1.  He was started on antibiotics last week appear clinically but these are not the Procrit once for these 2 infectious organisms at this time. He appears to need 2-6 weeks potentially of antibiotic therapy intravenously and will need a transesophageal echocardiogram to make sure there no vegetations. He was willing to be admitted. We had a long discussion about this with he and his wife who accompanied him today. I think this does interfere potentially with his prognosis especially if we cannot give him chemotherapy.

## 2012-08-11 NOTE — Patient Instructions (Addendum)
[facility] Discharge Instructions  RECOMMENDATIONS MADE BY THE CONSULTANT AND ANY TEST RESULTS WILL BE SENT TO YOUR REFERRING PHYSICIAN.  EXAM FINDINGS BY THE PHYSICIAN TODAY AND SIGNS OR SYMPTOMS TO REPORT TO CLINIC OR PRIMARY PHYSICIAN:   You have 2 different organisms growing in your blood. One is called enterococcus. There is potential that your stent is infected. It is very hard to clear the infection from the stent. You will need to be admitted to the hospital for 2 IV antibiotics (gentamycin/ampicillin). You will probably be here for approximately 5 days. Once we do another blood culture and see that the infection has left the blood we can discharge you from the hospital. You will continue to be on IV antibiotics for 2-6 weeks after your hospital stay.   You will need a ECHO of the heart through your esophagus to make sure there are no growths/vegetations there. IF there are no vegetations, then you will probably get 2 weeks worth of antibiotics. IF there are vegetations there then you will probably get 6 weeks of IV antibiotics.   We will perform another CT scan of your abd/pelvis to see how the abdomen/stents look.   Chemotherapy will be put on hold for now due to the infections you have.   It is ok to use your port-a-cath for your antibiotics.     Thank you for choosing [facility] to provide your oncology and hematology care.  To afford each patient quality time with our providers, please arrive at least 15 minutes before your scheduled appointment time.  With your help, our goal is to use those 15 minutes to complete the necessary work-up to ensure our physicians have the information they need to help with your evaluation and healthcare recommendations.    Effective January 1st, 2014, we ask that you re-schedule your appointment with our physicians should you arrive 10 or more minutes late for your appointment.  We strive to give you quality time  with our providers, and arriving late affects you and other patients whose appointments are after yours.    Again, thank you for choosing [facility].  Our hope is that these requests will decrease the amount of time that you wait before being seen by our physicians.       _____________________________________________________________  Should you have questions after your visit to [facility], please contact our office at [phone] between the hours of 8:30 a.m. and 5:00 p.m.  Voicemails left after 4:30 p.m. will not be returned until the following business day.  For prescription refill requests, have your pharmacy contact our office with your prescription refill request.

## 2012-08-11 NOTE — Progress Notes (Signed)
Lucas Patton presented for Portacath access and flush. Proper placement of portacath confirmed by CXR. Portacath located LT chest wall accessed with  H 20 needle. Good blood return present. Portacath flushed with 20ml NS.  Procedure without incident. Patient tolerated procedure well.  Patient accessed for inpatient use. Antimicrobial disc applied. Port drsg dated.

## 2012-08-11 NOTE — Progress Notes (Signed)
INITIAL NUTRITION ASSESSMENT  DOCUMENTATION CODES Per approved criteria  -Severe malnutrition in the context of chronic illness   INTERVENTION:  Magic Cup BID which will provide 580 kcal and 18 grams of protein  Highly recommend an appetite stimulant  NUTRITION DIAGNOSIS: Unintentional weight loss related to decreased appetite as evidenced by 29 pound weight loss (13% body weight) in 6 months.   Goal: Intake to meet >/=90% estimated nutrition needs  Monitor:  Tolerance and acceptance of Magic Cup, PO's, wt trends, and labs  Reason for Assessment: Consult: Unintentional Weight Loss  67 y.o. male  Admitting Dx: Expected endocarditis  ASSESSMENT: Pt admitted with expected endocarditis.  Pt reports usual weight of 240 pounds and that he last weighed this in October. He states loss of appetite and that he typically eats 2 meals per day and does not eat snacks in between meals. Pt meets criteria for severe malnutrition in the context of chronic disease based on 29 pound weight loss (13% body weight) in 6 months and >/=75% estimated energy intake for >/=1 month.   Pt states that he has tried Ensure in the past and is not interested in having it during admission. He states that he does like ice cream and sherbet and is willing to try Magic Cup. Will add this BID. Highly recommend appetite stimulant.  Height: Ht Readings from Last 1 Encounters:  07/28/12 6\' 2"  (1.88 m)    Weight: Wt Readings from Last 1 Encounters:  08/11/12 201 lb 12.8 oz (91.536 kg)    Ideal Body Weight: 190 lbs  % Ideal Body Weight: 106%  Wt Readings from Last 10 Encounters:  08/11/12 201 lb 12.8 oz (91.536 kg)  08/11/12 201 lb 12.8 oz (91.536 kg)  07/28/12 208 lb (94.348 kg)  07/28/12 208 lb (94.348 kg)  07/22/12 208 lb 4.8 oz (94.484 kg)  02/18/12 230 lb 9.6 oz (104.6 kg)  02/18/12 230 lb 9.6 oz (104.6 kg)    Usual Body Weight: 240 lbs, per pt report  % Usual Body Weight: 84%  BMI:  Body mass  index is 25.9 kg/(m^2).--Overweight  Estimated Nutritional Needs: Kcal: 2000-2200 Protein: 100-110 grams Fluid: >/=2 L/day  Skin: Left chest incision   Diet Order: General  EDUCATION NEEDS: -No education needs identified at this time  No intake or output data in the 24 hours ending 08/11/12 1432  Last BM: PTA   Labs:   Recent Labs Lab 08/05/12 0901 08/07/12 0824  NA 130* 130*  K 3.5 3.6  CL 96 95*  CO2 26 26  BUN 7 10  CREATININE 0.62 1.02  CALCIUM 8.4 8.7  GLUCOSE 169* 113*    CBG (last 3)  No results found for this basename: GLUCAP,  in the last 72 hours  Scheduled Meds: . ampicillin (OMNIPEN) IV  1 g Intravenous Q6H  . [START ON 08/12/2012] apixaban  2.5 mg Oral BID  . aspirin EC  81 mg Oral BID  . feeding supplement  1 Container Oral TID BM  . gentamicin  90 mg Intravenous Q8H  . [START ON 08/12/2012] lactulose  20 g Oral Daily  . [START ON 08/12/2012] levothyroxine  175 mcg Oral QAC breakfast  . multivitamin with minerals  1 tablet Oral Daily  . omega-3 acid ethyl esters  2 g Oral BID  . pantoprazole  40 mg Oral Daily  . senna  1 tablet Oral Daily  . traZODone  100 mg Oral QHS    Continuous Infusions: . sodium chloride  Past Medical History  Diagnosis Date  . Hypertension   . Thyroid disease   . Hypercholesteremia   . Hypothyroidism   . Myocardial infarction     Past Surgical History  Procedure Laterality Date  . Cardiac stents    . Coronary angioplasty    . Colonoscopy    . Cataract extraction, bilateral    . Ercp  02/19/2012    Procedure: ENDOSCOPIC RETROGRADE CHOLANGIOPANCREATOGRAPHY (ERCP);  Surgeon: Malissa Hippo, MD;  Location: [facility];  Service: Endoscopy;  Laterality: N/A;  . Sphincterotomy  02/19/2012    Procedure: SPHINCTEROTOMY;  Surgeon: Malissa Hippo, MD;  Location: [facility];  Service: Endoscopy;  Laterality: N/A;  . Biliary stent placement  02/19/2012    Procedure: BILIARY STENT PLACEMENT;  Surgeon: Malissa Hippo, MD;   Location: [facility];  Service: Endoscopy;  Laterality: N/A;  . Ercp w/ metal stent placement  March 2014    done at [facility]  . Portacath placement Left 07/28/2012    Procedure: INSERTION PORT-A-CATH;  Surgeon: Dalia Heading, MD;  Location: [facility];  Service: General;  Laterality: Left;  Insertion Port-A-Cath Left Subclavian     Trenton Gammon Dietetic Intern # [phone]

## 2012-08-11 NOTE — Progress Notes (Addendum)
ANTIBIOTIC CONSULT NOTE - INITIAL  Pharmacy Consult for Ampicillin and Gentamicin Indication: bacteremia with presumed endocarditis (S. Bovis)  Allergies  Allergen Reactions  . Metoprolol Other (See Comments)    depression   Patient Measurements: Weight: 201 lb 12.8 oz (91.536 kg) 6\' 2"   Vital Signs: Temp: 98.8 F (37.1 C) (04/21 1411) Temp src: Oral (04/21 1132) BP: 120/62 mmHg (04/21 1411) Pulse Rate: 88 (04/21 1411) Intake/Output from previous day:   Intake/Output from this shift:    Labs: No results found for this basename: WBC, HGB, PLT, LABCREA, CREATININE,  in the last 72 hours The CrCl is unknown because both a height and weight (above a minimum accepted value) are required for this calculation. No results found for this basename: VANCOTROUGH, Leodis Binet, VANCORANDOM, GENTTROUGH, GENTPEAK, GENTRANDOM, TOBRATROUGH, TOBRAPEAK, TOBRARND, AMIKACINPEAK, AMIKACINTROU, AMIKACIN,  in the last 72 hours   Microbiology: Recent Results (from the past 720 hour(s))  SURGICAL PCR SCREEN     Status: None   Collection Time    07/28/12  6:22 AM      Result Value Range Status   MRSA, PCR NEGATIVE  NEGATIVE Final   Staphylococcus aureus NEGATIVE  NEGATIVE Final   Comment:            The Xpert SA Assay (FDA     approved for NASAL specimens     in patients over 52 years of age),     is one component of     a comprehensive surveillance     program.  Test performance has     been validated by [employer] for patients greater     than or equal to 22 year old.     It is not intended     to diagnose infection nor to     guide or monitor treatment.  CULTURE, BLOOD (ROUTINE X 2)     Status: None   Collection Time    08/07/12 10:00 AM      Result Value Range Status   Specimen Description BLOOD PORTA CATH DRAWN BY RN WF   Final   Special Requests     Final   Value: BOTTLES DRAWN AEROBIC AND ANAEROBIC AEB=8CC ANA=6CC   Culture  Setup Time 08/09/2012 21:10   Final   Culture      Final   Value: STREPTOCOCCUS GROUP D;high probability for S.bovis     Note: Gram Stain Report Called to,Read Back By and Verified With: K. NEWMAN AT 1930 ON 08/08/12 BY Wynonia Lawman Performed at [facility]   Report Status 08/10/2012 FINAL   Final   Organism ID, Bacteria STREPTOCOCCUS GROUP D;high probability for S.bovis   Final  CULTURE, BLOOD (ROUTINE X 2)     Status: None   Collection Time    08/07/12 10:10 AM      Result Value Range Status   Specimen Description BLOOD RIGHT ARM   Final   Special Requests BOTTLES DRAWN AEROBIC AND ANAEROBIC 12CC   Final   Culture  Setup Time 08/08/2012 14:05   Final   Culture     Final   Value: ENTEROCOCCUS SPECIES     Note: PREVIOUSLY REPORTED AS Gram Positive Rods Gram Stain Report Called to,Read Back By and Verified With: NEWMAN K @ FLOW MANAGEMENT [id number] BY FORSYTH K CORRECTED RESULTS CALLED TO: PAULA HAMLETT @ 1517 08/08/12 BY [facility] COM[facility]ATION THERAPY OF HIGH      DOSE AMPICILLIN OR VANCOMYCIN, PLUS AN AMINOGLYCOSIDE, IS USUALLY  INDICATED FOR SERIOUS ENTEROCOCCAL INFECTIONS.   Report Status 08/10/2012 FINAL   Final   Organism ID, Bacteria ENTEROCOCCUS SPECIES   Final   Medical History: Past Medical History  Diagnosis Date  . Hypertension   . Thyroid disease   . Hypercholesteremia   . Hypothyroidism   . Myocardial infarction    Medications:  Scheduled:  . ampicillin (OMNIPEN) IV  1 g Intravenous Q6H  . [START ON 08/12/2012] apixaban  2.5 mg Oral BID  . aspirin EC  81 mg Oral BID  . feeding supplement  1 Container Oral TID BM  . gentamicin  90 mg Intravenous Q8H  . [START ON 08/12/2012] lactulose  20 g Oral Daily  . [START ON 08/12/2012] levothyroxine  175 mcg Oral QAC breakfast  . multivitamin with minerals  1 tablet Oral Daily  . omega-3 acid ethyl esters  2 g Oral BID  . pantoprazole  40 mg Oral Daily  . senna  1 tablet Oral Daily  . traZODone  100 mg Oral QHS   Assessment: 67yo male admitted for bacteremia with presumed  endocarditis. Cultures as above.  Discussed with Dr Lendell Caprice.  Pt has good renal fxn.  Estimated Creatinine Clearance: 81.7 ml/min (by C-G formula based on Cr of 1.02).    Also discussed with ID pharmacist at [facility].  Will Rx with high dose Ampicillin and Gentamicin for synergy.  Goal of Therapy:  Gentamicin trough level < 1 mcg/ml  Plan: Ampicillin 2gm IV q4hrs (12gm per day) Gentamicin 90mg  IV q8hrs for synergy (per AHA guidelines) Monitor labs, renal fxn, and cultures Duration of therapy per MD  Valrie Hart A 08/11/2012,2:20 PM

## 2012-08-11 NOTE — H&P (Addendum)
Hospital Admission Note Date: 08/11/2012  Patient name: Lucas Patton Medical record number: [id number] Date of birth: [birth year] Age: 67 y.o. Gender: male PCP: Kirk Ruths, MD  Attending physician: Christiane Ha, MD  Chief Complaint: Fevers, chills and vomiting  History of Present Illness:  Lucas Patton is an 67 y.o. 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 He had a fever to 101 last week and was seen in the cancer Center. Urinalysis showed no infection. Chest x-ray showed infiltrate versus atelectasis, and patient was started on levofloxacin. Blood cultures came back today positive for Enterobacter which is penicillin sensitive, and strep bovis. Patient was switched to cephalexin a few days ago when gram-positive rods were growing in blood cultures. Dr. Mariel Sleet spoke with Dr. Luciana Axe, infectious disease specialist. Patient will require admission for IV ampicillin and gentamicin and further workup for endocarditis. Should he have valvular vegetations, he would require a six-week course of antibiotics. If no endocarditis, 2 weeks of IV antibiotics. He is Port-A-Cath in place. Patient has a chronic cough which is unchanged. He's vomited intermittently since the fever occurred. He's had no abdominal pain. He has lost 50 pounds since being diagnosed with cancer. He has no appetite. He's had no hematemesis, no diarrhea. He's had no rash. No history of valvular heart disease. He has a history of multiple biliary stents for obstruction. Dr. Dionicia Abler recommend CT abdomen and pelvis looking for abscess or intra-abdominal infection. Patient had been receiving chemotherapy which was stopped early due to thrombocytopenia.  Past Medical History  Diagnosis Date  . Hypertension   . Thyroid disease   . Hypercholesteremia   . Hypothyroidism   . Myocardial infarction   . Cancer     Choleangiocarcinoma     Meds: Prescriptions prior to admission  Medication Sig Dispense Refill  . ALPRAZolam (XANAX) 0.5 MG tablet Take 0.5 mg by mouth 3 (three) times daily as needed for anxiety.       Marland Kitchen apixaban (ELIQUIS) 2.5 MG TABS tablet Take 2.5 mg by mouth 2 (two) times daily. To start after port placed on Wed or Thurs      . aspirin EC 81 MG tablet Take 81 mg by mouth 2 (two) times daily. To start after port-a-cath insertion      . cephALEXin (KEFLEX) 500 MG capsule Take 1 capsule (500 mg total) by mouth 4 (four) times daily.  28 capsule  0  . fish oil-omega-3 fatty acids 1000 MG capsule Take 2 g by mouth 2 (two) times daily.      Marland Kitchen lactulose (CHRONULAC) 10 GM/15ML solution Take 30 mLs by mouth daily.      Marland Kitchen levofloxacin (LEVAQUIN) 750 MG tablet Take 1 tablet (750 mg total) by mouth daily.  7 tablet  0  . levothyroxine (SYNTHROID, LEVOTHROID) 175 MCG tablet Take 175 mcg by mouth every morning.      . lidocaine-prilocaine (EMLA) cream Apply topically. Apply a quarter size amount to port site 1 hour prior to chemo. Do not rub in. Cover with plastic.      Marland Kitchen LORazepam (ATIVAN) 1 MG tablet Take by mouth. Take 1/2 tablet to 1 tablet every 4 hours IF needed for nausea/vomiting. Side Effect: Drowsiness/Sleepiness. Do not drive while taking.      . Multiple Vitamin (MULTIVITAMIN) capsule Take 1 capsule by mouth daily.      Marland Kitchen omeprazole (PRILOSEC) 20 MG capsule Take 20 mg by mouth 2 (two) times daily.      Marland Kitchen  ondansetron (ZOFRAN) 8 MG tablet Take 8 mg by mouth. Starting the 3rd day after chemo, may take 1 tablet every 8 hours IF needed for nausea/vomiting.      Marland Kitchen oxycodone (OXY-IR) 5 MG capsule Take 5 mg by mouth every 4 (four) hours as needed for pain.       Marland Kitchen senna (SENOKOT) 8.6 MG TABS Take 1 tablet by mouth daily.      . traZODone (DESYREL) 100 MG tablet Take 100 mg by mouth at bedtime.        Allergies: Metoprolol History   Social History  . Marital Status: Married    Spouse Name: N/A    Number of  Children: N/A  . Years of Education: N/A   Occupational History  . Not on file.   Social History Main Topics  . Smoking status: Former Smoker -- 1.00 packs/day for 37 years    Quit date: 04/22/2004  . Smokeless tobacco: Not on file  . Alcohol Use: No  . Drug Use: No  . Sexually Active: Yes    Birth Control/ Protection: None   Other Topics Concern  . Not on file   Social History Narrative  . No narrative on file   Family History  Problem Relation Age of Onset  . Other Mother   . Aortic aneurysm Father    Past Surgical History  Procedure Laterality Date  . Cardiac stents    . Coronary angioplasty    . Colonoscopy    . Cataract extraction, bilateral    . Ercp  02/19/2012    Procedure: ENDOSCOPIC RETROGRADE CHOLANGIOPANCREATOGRAPHY (ERCP);  Surgeon: Malissa Hippo, MD;  Location: [facility];  Service: Endoscopy;  Laterality: N/A;  . Sphincterotomy  02/19/2012    Procedure: SPHINCTEROTOMY;  Surgeon: Malissa Hippo, MD;  Location: [facility];  Service: Endoscopy;  Laterality: N/A;  . Biliary stent placement  02/19/2012    Procedure: BILIARY STENT PLACEMENT;  Surgeon: Malissa Hippo, MD;  Location: [facility];  Service: Endoscopy;  Laterality: N/A;  . Ercp w/ metal stent placement  March 2014    done at [facility]  . Portacath placement Left 07/28/2012    Procedure: INSERTION PORT-A-CATH;  Surgeon: Dalia Heading, MD;  Location: [facility];  Service: General;  Laterality: Left;  Insertion Port-A-Cath Left Subclavian    Review of Systems: Systems reviewed and as per HPI, otherwise negative.  Physical Exam: Blood pressure 120/62, pulse 88, temperature 98.8 F (37.1 C), temperature source Oral, resp. rate 18, height 6\' 2"  (1.88 m), weight 91.536 kg (201 lb 12.8 oz), SpO2 98.00%. BP 120/62  Pulse 88  Temp(Src) 101.7 F (38.7 C) (Oral)  Resp 18  Ht 6\' 2"  (1.88 m)  Wt 91.536 kg (201 lb 12.8 oz)  BMI 25.9 kg/m2  SpO2 98%  General Appearance:    Alert, cooperative, no distress, appears stated  age, much thinner than when I last saw the patient about 7 months ago.   Head:    Normocephalic, without obvious abnormality, atraumatic  Eyes:    PERRL, possibly mild scleral icterus., EOM's intact, fundi    benign, both eyes       Ears:    Normal TM's and external ear canals, both ears  Nose:   Nares normal, septum midline, mucosa normal, no drainage    or sinus tenderness  Throat:   Lips, mucosa, and tongue normal; teeth and gums normal  Neck:   Supple, symmetrical, trachea midline, no adenopathy;  thyroid:  No enlargement/tenderness/nodules; no carotid   bruit or JVD  Back:     Symmetric, no curvature, ROM normal, no CVA tenderness  Lungs:     Clear to auscultation bilaterally, respirations unlabored  Chest wall:    No tenderness or deformity  Heart:    Regular rate and rhythm, S1 and S2 normal, no murmur, rub   or gallop  Abdomen:     Soft, non-tender, bowel sounds active all four quadrants,    no masses, no organomegaly  Genitalia:   deferred   Rectal:   deferred   Extremities:   Extremities normal, atraumatic, no cyanosis or edema  Pulses:   2+ and symmetric all extremities  Skin:   Mary dry with poor turgor. Several erythematous scaly plaques on the extensor surfaces   Lymph nodes:   Cervical, supraclavicular, and axillary nodes normal  Neurologic:   CNII-XII intact. Normal strength, sensation and reflexes      throughout    Psychiatric: Flat affect with occasionally surpassing demeanor. Cooperative to  Lab results:  Sodium    127     Potassium    3.7     Chloride    92     CO2    26     BUN    6     Creatinine, Ser    0.62     Calcium    8.6     GFR calc non Af Amer    90 mL/min">90     GFR calc Af Amer    90 mL/min  The eGFR has been calculated using the CKD EPI equation. This calculation has not been validated in all clinical situations. eGFR's persistently <90 mL/min signify possible Chronic Kidney Disease.">9090 mL/min  The eGFR has been calculated using the CKD  EPI equation. This calculation has not been validated in all clinical situations. eGFR's persistently <90 mL/min signify possible Chronic Kidney Disease." border=0 src="[email or web address]:/[email or web address]/EN-[email or web address]/[email or web address]" width=5 height=10     Glucose, Bld    108     Alkaline Phosphatase    265     Albumin    2.6     AST    123     ALT    92     Total Protein    6.5     Total Bilirubin    3.1      CBC    WBC    5.2     RBC    3.53     Hemoglobin    10.8     HCT    30.7     MCV    87.0     MCH    30.6     MCHC    35.2     RDW    14.6     Platelets    113      DIFFERENTIAL    Neutrophils Relative    64     Lymphocytes Relative    15     Monocytes Relative    20     Eosinophils Relative    1     Basophils Relative    1     Neutro Abs    3.3     Lymphs Abs    0.8     Monocytes Absolute    1.0     Eosinophils Absolute    0.0     Basophils Absolute    0.0      DIABETES  Glucose, Bld    108      URINALYSIS    Color, Urine  AMBER        APPearance  CLEAR       Specific Gravity, Urine  1.010       pH  5.5       Glucose, UA  NEGATIVE       Bilirubin Urine  SMALL       Ketones, ur  TRACE       Protein, ur  TRACE       Urobilinogen, UA  0.2       Nitrite  NEGATIVE       Leukocytes, UA  NEGATIVE       Hgb urine dipstick  NEGATIVE       WBC, UA  0-2       RBC / HPF  0-2       Squamous Epithelial / LPF  FEW         Imaging results:  No results found.  Assessment & Plan: Principal Problem:   Bacteremia: Will repeat chest x-ray, get a transthoracic echocardiogram. If echo is negative, still does not rule out endocarditis without TEE. Discussed the case with cardiology. If transthoracic echo negative, will recommend TEE and patient agreeable. Get CT of the abdomen and pelvis with contrast to rule out intra-abdominal infection as the source. Ampicillin and gentamicin per infectious disease will be ordered. If no endocarditis, patient will  require at least 2 weeks of antibiotics after negative blood cultures. Will order 2 sets today. If endocarditis is present, will require 6 weeks. Active Problems:   Enterococcal septicemia   Streptococcus bovis septicemia   HTN (hypertension)   Malignant neoplasm of intrahepatic bile ducts: Patient has lost 50 pounds and at best prognosis is guarded according to Dr. Mariel Sleet.   CAD S/P percutaneous coronary angioplasty, stable. Continue aspirin   Hypothyroidism: Check TSH   HLD (hyperlipidemia)   Weight loss, unintentional: Will add in sure and get a dietitian consult.   Psoriasis   Previous history of DVT (deep venous thrombosis): Continue apixiban  patient reports that his CODE STATUS is DO NOT RESUSCITATE.   SULLIVAN,CORINNA L 08/11/2012, 3:34 PM   Addendum:  Transesophageal echocardiogram has been requested by the primary service in light of documented bacteremia as detailed above. Suspicion at this point is that this is a GI source, and transthoracic echocardiogram did not demonstrate obvious vegetations, however need to be certain that there is no evidence of active endocarditis to best gauge duration of antibiotic therapy. Potential risks have been discussed with the patient. He is in agreement to proceed.  Jonelle Sidle, M.D., F.A.C.C.

## 2012-08-11 NOTE — Progress Notes (Signed)
Reviewed student note and agree with assessment.   Keion Neels A. Kayan, RD, LDN Pager: [phone] 

## 2012-08-11 NOTE — Telephone Encounter (Signed)
I called Lucas Patton to follow-up on how he's feeling after being on antibiotics x 4 days - I spoke to pt's wife, [name], who states that his only complaint has been feeling cold.  Patient denies n/v/d, cough, fevers, chills - reports feeling cold.  [name] reports that she has been monitoring patient's temperature as instructed, and the highest temperature since last Thursday was 98 degrees.  Patient continues to take Keflex as prescribed.

## 2012-08-12 ENCOUNTER — Ambulatory Visit (HOSPITAL_COMMUNITY): Payer: Medicare HMO | Admitting: Oncology

## 2012-08-12 DIAGNOSIS — B952 Enterococcus as the cause of diseases classified elsewhere: Secondary | ICD-10-CM

## 2012-08-12 DIAGNOSIS — R7989 Other specified abnormal findings of blood chemistry: Secondary | ICD-10-CM

## 2012-08-12 DIAGNOSIS — E871 Hypo-osmolality and hyponatremia: Secondary | ICD-10-CM

## 2012-08-12 DIAGNOSIS — E039 Hypothyroidism, unspecified: Secondary | ICD-10-CM

## 2012-08-12 DIAGNOSIS — C221 Intrahepatic bile duct carcinoma: Secondary | ICD-10-CM

## 2012-08-12 DIAGNOSIS — R7881 Bacteremia: Secondary | ICD-10-CM

## 2012-08-12 DIAGNOSIS — C786 Secondary malignant neoplasm of retroperitoneum and peritoneum: Secondary | ICD-10-CM

## 2012-08-12 DIAGNOSIS — I517 Cardiomegaly: Secondary | ICD-10-CM

## 2012-08-12 LAB — COMPREHENSIVE METABOLIC PANEL
ALT: 71 U/L — ABNORMAL HIGH (ref 0–53)
Alkaline Phosphatase: 240 U/L — ABNORMAL HIGH (ref 39–117)
CO2: 27 mEq/L (ref 19–32)
GFR calc Af Amer: >90 mL/min (ref >90)
Glucose, Bld: 116 mg/dL — ABNORMAL HIGH (ref 70–99)
Potassium: 3.8 mEq/L (ref 3.5–5.1)
Sodium: 130 mEq/L — ABNORMAL LOW (ref 135–145)
Total Protein: 6.3 g/dL (ref 6.0–8.3)

## 2012-08-12 MED ORDER — LACTULOSE 10 GM/15ML PO SOLN: 20.0000 g | Freq: Every day | ORAL | Status: DC | PRN

## 2012-08-12 MED ORDER — LEVOTHYROXINE SODIUM 100 MCG PO TABS
200.0000 ug | ORAL_TABLET | Freq: Every day | ORAL | Status: DC
  Administered 2012-08-14: 200 ug via ORAL
  Filled 2012-08-12: qty 2

## 2012-08-12 MED ORDER — SIMETHICONE 80 MG PO CHEW
80.0000 mg | CHEWABLE_TABLET | Freq: Four times a day (QID) | ORAL | Status: DC | PRN
  Administered 2012-08-12: 80 mg via ORAL
  Filled 2012-08-12: qty 1

## 2012-08-12 MED ORDER — GENTAMICIN SULFATE 40 MG/ML IJ SOLN
INTRAMUSCULAR | Status: AC
  Filled 2012-08-12: qty 4

## 2012-08-12 MED ORDER — GENTAMICIN SULFATE 10 MG/ML IJ SOLN
INTRAMUSCULAR | Status: AC
  Filled 2012-08-12: qty 2

## 2012-08-12 NOTE — Progress Notes (Signed)
UR Chart Review Completed  

## 2012-08-12 NOTE — Progress Notes (Signed)
*  PRELIMINARY RESULTS* Echocardiogram 2D Echocardiogram has been performed.  Lucas Patton 08/12/2012, 10:45 AM

## 2012-08-12 NOTE — Progress Notes (Signed)
Discussed with Drs. Neijstrom and Rehman  Subjective: Feels a little better.  Nausea better. Eating a bit.  No pain.  Chronic constipation and cold intolerance  Objective: Vital signs in last 24 hours: Filed Vitals:   08/11/12 1843 08/11/12 2211 08/12/12 0514 08/12/12 1344  BP:  96/55 91/53 111/62  Pulse:  79 76 71  Temp: 101.1 F (38.4 C) 98.5 F (36.9 C) 98.3 F (36.8 C) 98 F (36.7 C)  TempSrc: Oral Oral    Resp:  20 20 16   Height:      Weight:      SpO2:  95% 92% 97%   Weight change:   Intake/Output Summary (Last 24 hours) at 08/12/12 1918 Last data filed at 08/12/12 1700  Gross per 24 hour  Intake    720 ml  Output   2400 ml  Net  -1680 ml   Gen:  Non-toxic in chair. HEENT:  Scleral icterus LUngs CTA without WRR CV RRR without MGR Abd s,nt, nd Ext:  Minimal edema Skin:  Mild jaundice. Skin dry  Lab Results: Basic Metabolic Panel:  Recent Labs Lab 08/11/12 1605 08/12/12 1111  NA 127* 130*  K 3.7 3.8  CL 92* 96  CO2 26 27  GLUCOSE 108* 116*  BUN 6 6  CREATININE 0.62 0.72  CALCIUM 8.6 8.5   Liver Function Tests:  Recent Labs Lab 08/11/12 1605 08/12/12 1111  AST 123* 67*  ALT 92* 71*  ALKPHOS 265* 240*  BILITOT 3.1* 2.5*  PROT 6.5 6.3  ALBUMIN 2.6* 2.6*   No results found for this basename: LIPASE, AMYLASE,  in the last 168 hours No results found for this basename: AMMONIA,  in the last 168 hours CBC:  Recent Labs Lab 08/07/12 0823 08/11/12 1605  WBC 8.7 5.2  NEUTROABS 8.0* 3.3  HGB 12.0* 10.8*  HCT 34.6* 30.7*  MCV 88.9 87.0  PLT 45* 113*  Thyroid Function Tests:  Recent Labs Lab 08/11/12 1606  TSH 12.163*   Coagulation: No results found for this basename: LABPROT, INR,  in the last 168 hours Anemia Panel:  Recent Labs Lab 08/07/12 0823  VITAMINB12 560  FOLATE 12.5   Urine Drug Screen: Drugs of Abuse  No results found for this basename: labopia, cocainscrnur, labbenz, amphetmu, thcu, labbarb    Alcohol Level: No  results found for this basename: ETH,  in the last 168 hours Urinalysis:  Recent Labs Lab 08/07/12 1130  COLORURINE AMBER*  LABSPEC 1.010  PHURINE 5.5  GLUCOSEU NEGATIVE  HGBUR NEGATIVE  BILIRUBINUR SMALL*  KETONESUR TRACE*  PROTEINUR TRACE*  UROBILINOGEN 0.2  NITRITE NEGATIVE  LEUKOCYTESUR NEGATIVE   Micro Results: Recent Results (from the past 240 hour(s))  CULTURE, BLOOD (ROUTINE X 2)     Status: None   Collection Time    08/07/12 10:00 AM      Result Value Range Status   Specimen Description BLOOD PORTA CATH DRAWN BY RN WF   Final   Special Requests     Final   Value: BOTTLES DRAWN AEROBIC AND ANAEROBIC AEB=8CC ANA=6CC   Culture  Setup Time 08/09/2012 21:10   Final   Culture     Final   Value: STREPTOCOCCUS GROUP D;high probability for S.bovis     Note: Gram Stain Report Called to,Read Back By and Verified With: K. NEWMAN AT 1930 ON 08/08/12 BY Wynonia Lawman Performed at [facility]   Report Status 08/10/2012 FINAL   Final   Organism ID, Bacteria STREPTOCOCCUS GROUP D;high  probability for S.bovis   Final  CULTURE, BLOOD (ROUTINE X 2)     Status: None   Collection Time    08/07/12 10:10 AM      Result Value Range Status   Specimen Description BLOOD RIGHT ARM   Final   Special Requests BOTTLES DRAWN AEROBIC AND ANAEROBIC 12CC   Final   Culture  Setup Time 08/08/2012 14:05   Final   Culture     Final   Value: ENTEROCOCCUS SPECIES     Note: PREVIOUSLY REPORTED AS Gram Positive Rods Gram Stain Report Called to,Read Back By and Verified With: NEWMAN K @ FLOW MANAGEMENT [id number] BY FORSYTH K CORRECTED RESULTS CALLED TO: PAULA HAMLETT @ 1517 08/08/12 BY [facility]ERAPY OF HIGH      DOSE AMPICILLIN OR VANCOMYCIN, PLUS AN AMINOGLYCOSIDE, IS USUALLY INDICATED FOR SERIOUS ENTEROCOCCAL INFECTIONS.   Report Status 08/10/2012 FINAL   Final   Organism ID, Bacteria ENTEROCOCCUS SPECIES   Final  CULTURE, BLOOD (ROUTINE X 2)     Status: None   Collection Time     08/11/12  2:08 PM      Result Value Range Status   Specimen Description Blood BLOOD RIGHT ARM   Final   Special Requests NONE 10 CC EACH   Final   Culture PENDING   Incomplete   Report Status PENDING   Incomplete   Studies/Results: Dg Chest 2 View  08/11/2012  *RADIOLOGY REPORT*  Clinical Data: Fever.  Bacteremia.  CHEST - 2 VIEW  Comparison: 08/07/2012  Findings: Mild hyperinflation.  Left-sided Port-A-Cath which terminates at the mid SVC.  Midline trachea.  Normal heart size and mediastinal contours for age.  No pleural effusion or pneumothorax.  Right greater than left upper lobe predominant scarring, with calcified granulomas bilaterally.  Right base opacity is similar back to 02/18/2012, favored to represent an area of scarring.  There is patchy left base pulmonary opacity which is similar to on the prior exam. Possibly increased since 02/18/2012 (especially on the lateral view).  IMPRESSION: Hyperinflation with extensive bilateral scarring.  Left base opacity which may be slightly increased since 02/18/2012 but is similar to 08/07/2012.  Either progressive scarring or mild superimposed infection/aspiration.   Original Report Authenticated By: Jeronimo Greaves, M.D.    Ct Abdomen Pelvis W Contrast  08/11/2012  *RADIOLOGY REPORT*  Clinical Data: Sepsis.  Cholangiocarcinoma with stent.  Question infection/abscess.  Fever and abdominal pain.  CT ABDOMEN AND PELVIS WITH CONTRAST  Technique:  Multidetector CT imaging of the abdomen and pelvis was performed following the standard protocol during bolus administration of intravenous contrast.  Contrast: 1 OMNIPAQUE IOHEXOL 300 MG/ML  SOLN, 1 OMNIPAQUE IOHEXOL 300 MG/ML  SOLN 100  ml Omnipaque-300  Comparison: 07/24/2012  Findings: Lung bases:  Bibasilar opacities which are favored to represent areas of scarring and atelectasis.  Mild cardiomegaly with coronary artery atherosclerosis.  Small right pleural effusion is unchanged.  Abdomen/pelvis:  Mild cirrhosis  suspected.  Mild intrahepatic biliary ductal dilatation which is unchanged.  Bilateral intrahepatic biliary stents, both terminating at the distal common duct.  There is surrounding amorphous soft tissue density in the region of the hepatic hilum and adjacent porta hepatis.  This measures 4.3 x 4.8 cm on image 32/series 2 and may be slightly increased since prior.  There is air in this region, increased, including on image 31/series 2.  The branch portal veins are patent.  There is mass effect upon the portal vein in the region of  the hepatic hilum on image 28/series 2.  No focal liver lesion.  Mild splenomegaly.  Normal stomach.  A descending duodenal diverticulum.  Normal pancreas, adrenal glands.  Upper pole left renal too small to characterize lesions.  Nonaneurysmal infrarenal aortic dilatation. No retroperitoneal or retrocrural adenopathy.  Sigmoid diverticulosis with wall thickening, likely due to muscular hypertrophy.  Scattered diverticula throughout remainder of the colon.  Prominent fatty ileocecal valve. Appendix is not visualized but there is no evidence of right lower quadrant inflammation.  Normal small bowel loops.  Trace perihepatic ascites which is similar.  No well-defined peritoneal/omental metastasis.  No pelvic adenopathy.    Normal urinary bladder and prostate. Slight increase in the small volume cul-de-sac fluid, simple in appearance.  Bones/Musculoskeletal:  Left sacroiliac degenerative partial fusion.  IMPRESSION:  1.  Bilateral biliary stents in place.  Persistent mild intrahepatic ductal dilatation with similar to slight increase in amorphous soft tissue density in the region of the hepatic hilum. This is likely the site of the patient's reported cholangiocarcinoma.  There is gas in this area, adjacent to the biliary stents.  Although this could represent pneumobilia, tumor necrosis could have a similar appearance.  No evidence of drainable fluid collection or significant enhancement to  suggest abscess. 2.  No other explanation for sepsis. 3.  Slight increase and the small volume pelvic ascites. 4.  Persistent small right pleural effusion with bibasilar opacities which are favored to represent areas of atelectasis or scar. 5.  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   Original Report Authenticated By: Jeronimo Greaves, M.D.    Scheduled Meds: . ampicillin (OMNIPEN) IV  2 g Intravenous Q4H  . apixaban  2.5 mg Oral BID  . aspirin EC  81 mg Oral BID  . feeding supplement  1 Container Oral TID BM  . gentamicin  90 mg Intravenous Q8H  . hydrocerin   Topical BID  . [START ON 08/13/2012] levothyroxine  200 mcg Oral QAC breakfast  . multivitamin with minerals  1 tablet Oral Daily  . omega-3 acid ethyl esters  2 g Oral BID  . pantoprazole  40 mg Oral Daily  . senna  1 tablet Oral Daily  . traZODone  100 mg Oral QHS   Continuous Infusions: . sodium chloride 100 mL/hr at 08/11/12 1528   PRN Meds:.acetaminophen, acetaminophen, alum & mag hydroxide-simeth, lactulose, LORazepam, morphine injection, ondansetron (ZOFRAN) IV, ondansetron, oxyCODONE, simethicone Assessment/Plan: Principal Problem:   Bacteremia, enterococcus and strep bovis:  TransTHORACIC echo pending. For Transesophageal echo tomorrow to asses for valvulare vegetation.  If LFTs worsen, will require transfer to [facility] for biliary stent evaluation (may be infected).  Continue current abx.  Pneumonia:  Received several doses of levaquin.  Check urine legionella and strep pneumonia    HTN (hypertension)    Cholangiocarcinoma with peritoneal mets:  No chemo until infection worked up and stabilized.    Hyponatremia secondary to N/V, cancer and hyponatremia. Improved after saline    CAD S/P percutaneous coronary angioplasty    Hypothyroidism:  TSH high.  Will increase synthroid to 200 mcg    HLD (hyperlipidemia)    Weight loss, unintentional    Psoriasis    DVT (deep venous  thrombosis)   LOS: 1 day   Denica Web L 08/12/2012, 7:18 PM

## 2012-08-12 NOTE — Consult Note (Signed)
Referring Provider: No ref. provider found Primary Care Physician:  Kirk Ruths, MD Primary Gastroenterologist:  Dr. Karilyn Cota  Reason for Consultation: Bacteremia and elevated bilirubin and transaminases in a patient with history of cholangiocarcinoma with biliary stents. HPI:  Patient is 67 year old Caucasian male who is well known to me from initial evaluation back in October 2013 when he presented with obstructive jaundice and cholangitis. He was stented and transferred to [facility]. 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 Obstructive jaundice was treated with biliary stenting and he was referred to Dr. Laurie Panda for. If chemotherapy. He has received first cycle of chemotherapy earlier this month. Last week he developed fever and chills and had blood cultures. Which are reported to be positive yesterday. Furthermore he had another episode of fever and chills with nausea vomiting yesterday. He was admitted to Dr. Pincus Sanes service and begun on IV antibiotics. He feels better today. He denies abdominal pain. He states he has lost 50 pounds in the last 6 months. He is also being evaluated by cardiology to rule out endocarditis since he has group D. Streptococcus in his blood in addition to enterococcus. He denies chest pain or shortness of breath. He complains of low back pain which has gotten worse over the last several days. I have reviewed the patient's abdominopelvic CT from yesterday and compared with a study from April 2014.   Past Medical History  Diagnosis Date  . Hypertension   . Thyroid disease   . Hypercholesteremia   . Hypothyroidism   . Myocardial infarction   . Cancer     Choleangiocarcinoma    Past Surgical History  Procedure Laterality Date  . Cardiac stents    . Coronary angioplasty    . Colonoscopy    . Cataract extraction, bilateral    . Ercp  02/19/2012    Procedure: ENDOSCOPIC RETROGRADE  CHOLANGIOPANCREATOGRAPHY (ERCP);  Surgeon: Malissa Hippo, MD;  Location: [facility];  Service: Endoscopy;  Laterality: N/A;  . Sphincterotomy  02/19/2012    Procedure: SPHINCTEROTOMY;  Surgeon: Malissa Hippo, MD;  Location: [facility];  Service: Endoscopy;  Laterality: N/A;  . Biliary stent placement  02/19/2012    Procedure: BILIARY STENT PLACEMENT;  Surgeon: Malissa Hippo, MD;  Location: [facility];  Service: Endoscopy;  Laterality: N/A;  . Ercp w/ metal stent placement  March 2014    done at [facility]  . Portacath placement Left 07/28/2012    Procedure: INSERTION PORT-A-CATH;  Surgeon: Dalia Heading, MD;  Location: [facility];  Service: General;  Laterality: Left;  Insertion Port-A-Cath Left Subclavian    Prior to Admission medications   Medication Sig Start Date End Date Taking? Authorizing Provider  apixaban (ELIQUIS) 2.5 MG TABS tablet Take 2.5 mg by mouth 2 (two) times daily. To start after port placed on Wed or Thurs   Yes Historical Provider, MD  aspirin EC 81 MG tablet Take 81 mg by mouth 2 (two) times daily. To start after port-a-cath insertion   Yes Historical Provider, MD  fish oil-omega-3 fatty acids 1000 MG capsule Take 2 g by mouth 2 (two) times daily.   Yes Historical Provider, MD  lactulose (CHRONULAC) 10 GM/15ML solution Take 30 mLs by mouth daily.   Yes Historical Provider, MD  levofloxacin (LEVAQUIN) 750 MG tablet Take 1 tablet (750 mg total) by mouth daily. 08/07/12  Yes Maurine Minister Kefalas, PA-C  levothyroxine (SYNTHROID, LEVOTHROID) 175 MCG tablet Take 175 mcg by mouth  every morning.   Yes Historical Provider, MD  LORazepam (ATIVAN) 1 MG tablet Take by mouth. Take 1/2 tablet to 1 tablet every 4 hours IF needed for nausea/vomiting. Side Effect: Drowsiness/Sleepiness. Do not drive while taking.   Yes Historical Provider, MD  Multiple Vitamin (MULTIVITAMIN) capsule Take 1 capsule by mouth daily.   Yes Historical Provider, MD  omeprazole (PRILOSEC) 20 MG capsule Take 20 mg by mouth 2 (two) times  daily.   Yes Historical Provider, MD  ondansetron (ZOFRAN) 8 MG tablet Take 8 mg by mouth. Starting the 3rd day after chemo, may take 1 tablet every 8 hours IF needed for nausea/vomiting.   Yes Historical Provider, MD  oxycodone (OXY-IR) 5 MG capsule Take 5 mg by mouth every 4 (four) hours as needed for pain.    Yes Historical Provider, MD  senna (SENOKOT) 8.6 MG TABS Take 1 tablet by mouth daily.   Yes Historical Provider, MD  traZODone (DESYREL) 100 MG tablet Take 100 mg by mouth at bedtime.   Yes Historical Provider, MD  lidocaine-prilocaine (EMLA) cream Apply topically. Apply a quarter size amount to port site 1 hour prior to chemo. Do not rub in. Cover with plastic.    Historical Provider, MD    Current Facility-Administered Medications  Medication Dose Route Frequency Provider Last Rate Last Dose  . 0.9 %  sodium chloride infusion   Intravenous Continuous Christiane Ha, MD 100 mL/hr at 08/11/12 1528    . acetaminophen (TYLENOL) tablet 650 mg  650 mg Oral Q6H PRN Christiane Ha, MD   650 mg at 08/11/12 1750   Or  . acetaminophen (TYLENOL) suppository 650 mg  650 mg Rectal Q6H PRN Christiane Ha, MD      . alum & mag hydroxide-simeth (MAALOX/MYLANTA) 200-200-20 MG/5ML suspension 30 mL  30 mL Oral Q6H PRN Christiane Ha, MD   30 mL at 08/11/12 2059  . ampicillin (OMNIPEN) 2 g in sodium chloride 0.9 % 50 mL IVPB  2 g Intravenous Q4H Christiane Ha, MD   2 g at 08/12/12 0547  . apixaban (ELIQUIS) tablet 2.5 mg  2.5 mg Oral BID Christiane Ha, MD      . aspirin EC tablet 81 mg  81 mg Oral BID Christiane Ha, MD   81 mg at 08/11/12 2052  . feeding supplement (ENSURE) pudding 1 Container  1 Container Oral TID BM Christiane Ha, MD      . gentamicin (GARAMYCIN) 90 mg in dextrose 5 % 50 mL IVPB  90 mg Intravenous Q8H Christiane Ha, MD   90 mg at 08/12/12 0106  . hydrocerin (EUCERIN) cream   Topical BID Christiane Ha, MD      . lactulose (CHRONULAC) 10  GM/15ML solution 20 g  20 g Oral Daily Christiane Ha, MD      . levothyroxine (SYNTHROID, LEVOTHROID) tablet 175 mcg  175 mcg Oral QAC breakfast Christiane Ha, MD      . LORazepam (ATIVAN) tablet 0.5 mg  0.5 mg Oral Q4H PRN Christiane Ha, MD      . morphine 2 MG/ML injection 2 mg  2 mg Intravenous Q2H PRN Christiane Ha, MD      . multivitamin with minerals tablet 1 tablet  1 tablet Oral Daily Christiane Ha, MD   1 tablet at 08/11/12 1528  . omega-3 acid ethyl esters (LOVAZA) capsule 2 g  2 g Oral BID Christiane Ha, MD  2 g at 08/11/12 2052  . ondansetron (ZOFRAN) tablet 4 mg  4 mg Oral Q6H PRN Christiane Ha, MD       Or  . ondansetron [facility]) injection 4 mg  4 mg Intravenous Q6H PRN Christiane Ha, MD      . oxyCODONE (Oxy IR/ROXICODONE) immediate release tablet 5 mg  5 mg Oral Q4H PRN Christiane Ha, MD   5 mg at 08/11/12 1750  . pantoprazole (PROTONIX) EC tablet 40 mg  40 mg Oral Daily Christiane Ha, MD   40 mg at 08/11/12 1527  . senna (SENOKOT) tablet 8.6 mg  1 tablet Oral Daily Christiane Ha, MD   8.6 mg at 08/11/12 1528  . traZODone (DESYREL) tablet 100 mg  100 mg Oral QHS Christiane Ha, MD   100 mg at 08/11/12 2052    Allergies as of 08/11/2012 - Review Complete 08/11/2012  Allergen Reaction Noted  . Metoprolol Other (See Comments) 07/22/2012    Family History  Problem Relation Age of Onset  . Other Mother   . Aortic aneurysm Father     History   Social History  . Marital Status: Married    Spouse Name: N/A    Number of Children: N/A  . Years of Education: N/A   Occupational History  . Not on file.   Social History Main Topics  . Smoking status: Former Smoker -- 1.00 packs/day for 37 years    Quit date: 04/22/2004  . Smokeless tobacco: Not on file  . Alcohol Use: No  . Drug Use: No  . Sexually Active: Yes    Birth Control/ Protection: None   Other Topics Concern  . Not on file   Social History  Narrative  . No narrative on file    Review of Systems: See HPI, otherwise normal ROS  Physical Exam: Temp:  [98.3 F (36.8 C)-101.7 F (38.7 C)] 98.3 F (36.8 C) (04/22 0514) Pulse Rate:  [76-90] 76 (04/22 0514) Resp:  [18-20] 20 (04/22 0514) BP: (91-127)/(53-62) 91/53 mmHg (04/22 0514) SpO2:  [92 %-98 %] 92 % (04/22 0514) Weight:  [201 lb 12.8 oz (91.536 kg)] 201 lb 12.8 oz (91.536 kg) (04/21 1411) Last BM Date: 08/11/12 Pleasant well developed thin Caucasian male who is in no acute distress.. Conjunctiva is pink. Sclera is nonicteric. Oropharyngeal mucosa is normal. No neck masses or thyromegaly noted. Cardiac exam with regular rhythm normal S1 and S2. No murmur or gallop noted Abdomen is symmetrical with normal bowel sounds. Abdomen is soft. Prominent left lobe of the liver which is mildly tender. Right lobe is less tender. No peripheral edema or clubbing noted his nails have irregular rough-surface.  Intake/Output from previous day: 04/21 0701 - 04/22 0700 In: 533.3 [P.O.:240; I.V.:191; IV Piggyback:102.3] Out: 1500 [Urine:1500] Intake/Output this shift:    Lab Results:  Recent Labs  08/11/12 1605  WBC 5.2  HGB 10.8*  HCT 30.7*  PLT 113*   BMET  Recent Labs  08/11/12 1605  NA 127*  K 3.7  CL 92*  CO2 26  GLUCOSE 108*  BUN 6  CREATININE 0.62  CALCIUM 8.6   LFT  Recent Labs  08/11/12 1605  PROT 6.5  ALBUMIN 2.6*  AST 123*  ALT 92*  ALKPHOS 265*  BILITOT 3.1*   PT/INR No results found for this basename: LABPROT, INR,  in the last 72 hours Hepatitis Panel No results found for this basename: HEPBSAG, HCVAB, HEPAIGM, HEPBIGM,  in the last  72 hours  Studies/Results: Dg Chest 2 View  08/11/2012  *RADIOLOGY REPORT*  Clinical Data: Fever.  Bacteremia.  CHEST - 2 VIEW  Comparison: 08/07/2012  Findings: Mild hyperinflation.  Left-sided Port-A-Cath which terminates at the mid SVC.  Midline trachea.  Normal heart size and mediastinal contours for age.   No pleural effusion or pneumothorax.  Right greater than left upper lobe predominant scarring, with calcified granulomas bilaterally.  Right base opacity is similar back to 02/18/2012, favored to represent an area of scarring.  There is patchy left base pulmonary opacity which is similar to on the prior exam. Possibly increased since 02/18/2012 (especially on the lateral view).  IMPRESSION: Hyperinflation with extensive bilateral scarring.  Left base opacity which may be slightly increased since 02/18/2012 but is similar to 08/07/2012.  Either progressive scarring or mild superimposed infection/aspiration.   Original Report Authenticated By: Jeronimo Greaves, M.D.    Ct Abdomen Pelvis W Contrast  08/11/2012  *RADIOLOGY REPORT*  Clinical Data: Sepsis.  Cholangiocarcinoma with stent.  Question infection/abscess.  Fever and abdominal pain.  CT ABDOMEN AND PELVIS WITH CONTRAST  Technique:  Multidetector CT imaging of the abdomen and pelvis was performed following the standard protocol during bolus administration of intravenous contrast.  Contrast: 1 OMNIPAQUE IOHEXOL 300 MG/ML  SOLN, 1 OMNIPAQUE IOHEXOL 300 MG/ML  SOLN 100  ml Omnipaque-300  Comparison: 07/24/2012  Findings: Lung bases:  Bibasilar opacities which are favored to represent areas of scarring and atelectasis.  Mild cardiomegaly with coronary artery atherosclerosis.  Small right pleural effusion is unchanged.  Abdomen/pelvis:  Mild cirrhosis suspected.  Mild intrahepatic biliary ductal dilatation which is unchanged.  Bilateral intrahepatic biliary stents, both terminating at the distal common duct.  There is surrounding amorphous soft tissue density in the region of the hepatic hilum and adjacent porta hepatis.  This measures 4.3 x 4.8 cm on image 32/series 2 and may be slightly increased since prior.  There is air in this region, increased, including on image 31/series 2.  The branch portal veins are patent.  There is mass effect upon the portal vein in the  region of the hepatic hilum on image 28/series 2.  No focal liver lesion.  Mild splenomegaly.  Normal stomach.  A descending duodenal diverticulum.  Normal pancreas, adrenal glands.  Upper pole left renal too small to characterize lesions.  Nonaneurysmal infrarenal aortic dilatation. No retroperitoneal or retrocrural adenopathy.  Sigmoid diverticulosis with wall thickening, likely due to muscular hypertrophy.  Scattered diverticula throughout remainder of the colon.  Prominent fatty ileocecal valve. Appendix is not visualized but there is no evidence of right lower quadrant inflammation.  Normal small bowel loops.  Trace perihepatic ascites which is similar.  No well-defined peritoneal/omental metastasis.  No pelvic adenopathy.    Normal urinary bladder and prostate. Slight increase in the small volume cul-de-sac fluid, simple in appearance.  Bones/Musculoskeletal:  Left sacroiliac degenerative partial fusion.  IMPRESSION:  1.  Bilateral biliary stents in place.  Persistent mild intrahepatic ductal dilatation with similar to slight increase in amorphous soft tissue density in the region of the hepatic hilum. This is likely the site of the patient's reported cholangiocarcinoma.  There is gas in this area, adjacent to the biliary stents.  Although this could represent pneumobilia, tumor necrosis could have a similar appearance.  No evidence of drainable fluid collection or significant enhancement to suggest abscess. 2.  No other explanation for sepsis. 3.  Slight increase and the small volume pelvic ascites. 4.  Persistent small  right pleural effusion with bibasilar opacities which are favored to represent areas of atelectasis or scar. 5.  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   Original Report Authenticated By: Jeronimo Greaves, M.D.     Assessment;  Patient is 67 year old Caucasian male with history of metastatic cholangiocarcinoma who is receiving palliative chemotherapy under  the supervision by Dr. Mariel Sleet. Patient developed fever last last week and again yesterday morning. His  blood cultures from last week are positive for enterococcus and group B strep possibly strep bovis. Patient admitted yesterday for IV antibiotic therapy. Bilirubin yesterday was 3.1 while it had been normal previously. Left lobe of liver is quite tender. He does not have fluid collection or an obvious abscess on CT. Intrahepatic biliary dilation does not appear to be any more pronounced than on previous CT of 07/24/2012. Suspect cholangitis secondary to stent occlusion. Patient had wallstents placed in left and right artery [facility] at [facility]. Echo is pending. Long-term prognosis appears to be poor given the nature of his disease. Soft tissue density in hilar region has increased but it is too early to tell response to chemotherapy since he only has received one cycle.  Recommendations; Continue IV antibiotics. Repeat lab in am. He may need another ERCP if cholestasis is progressive. Will confer with Dr. Gayland Curry, patient's biliary attending at [facility].   LOS: 1 day   REHMAN,NAJEEB U  08/12/2012, 9:44 AM    Addendum; I reviewed patient's condition with Dr. Mike Gip of biliary service at [facility](Dr. Gayland Curry was not available). She recommended ERCP if bilirubin and transaminases continue to rise. Patient will have to be transferred to [facility] should this intervention be necessary.

## 2012-08-12 NOTE — Progress Notes (Signed)
Subjective: Patient seen at bedside with wife and 1 daughters.   I personally reviewed and went over radiographic studies with the patient.  Patient admits to a decrease in chills which is hopefully a sign of improvement regarding bacteremia.  TEE was performed and report not available at time of visit with patient.   Patient educated regarding chemotherapy.  Chemotherapy will need to be held until his bacteremia is cleared due to decreased blood counts as a result of systemic chemotherapy.  Patient and family understands this. Of course without chemotherapy, his cancer has the ability to grow.   Objective: Vital signs in last 24 hours: Temp:  [98 F (36.7 C)-98.5 F (36.9 C)] 98 F (36.7 C) (04/22 1344) Pulse Rate:  [71-79] 71 (04/22 1344) Resp:  [16-20] 16 (04/22 1344) BP: (91-111)/(53-62) 111/62 mmHg (04/22 1344) SpO2:  [92 %-97 %] 97 % (04/22 1344)  Intake/Output from previous day: 04/21 0800 - 04/22 0759 In: 533.3 [P.O.:240; I.V.:191; IV Piggyback:102.3] Out: 1500 [Urine:1500] Intake/Output this shift: Total I/O In: 720 [P.O.:720] Out: 900 [Urine:900]  General appearance: alert, cooperative, appears stated age, no distress and in recliner  Lab Results:   Recent Labs  08/11/12 1605  WBC 5.2  HGB 10.8*  HCT 30.7*  PLT 113*   BMET  Recent Labs  08/11/12 1605 08/12/12 1111  NA 127* 130*  K 3.7 3.8  CL 92* 96  CO2 26 27  GLUCOSE 108* 116*  BUN 6 6  CREATININE 0.62 0.72  CALCIUM 8.6 8.5    Studies/Results: Dg Chest 2 View  08/11/2012  *RADIOLOGY REPORT*  Clinical Data: Fever.  Bacteremia.  CHEST - 2 VIEW  Comparison: 08/07/2012  Findings: Mild hyperinflation.  Left-sided Port-A-Cath which terminates at the mid SVC.  Midline trachea.  Normal heart size and mediastinal contours for age.  No pleural effusion or pneumothorax.  Right greater than left upper lobe predominant scarring, with calcified granulomas bilaterally.  Right base opacity is similar back to  02/18/2012, favored to represent an area of scarring.  There is patchy left base pulmonary opacity which is similar to on the prior exam. Possibly increased since 02/18/2012 (especially on the lateral view).  IMPRESSION: Hyperinflation with extensive bilateral scarring.  Left base opacity which may be slightly increased since 02/18/2012 but is similar to 08/07/2012.  Either progressive scarring or mild superimposed infection/aspiration.   Original Report Authenticated By: Jeronimo Greaves, M.D.    Ct Abdomen Pelvis W Contrast  08/11/2012  *RADIOLOGY REPORT*  Clinical Data: Sepsis.  Cholangiocarcinoma with stent.  Question infection/abscess.  Fever and abdominal pain.  CT ABDOMEN AND PELVIS WITH CONTRAST  Technique:  Multidetector CT imaging of the abdomen and pelvis was performed following the standard protocol during bolus administration of intravenous contrast.  Contrast: 1 OMNIPAQUE IOHEXOL 300 MG/ML  SOLN, 1 OMNIPAQUE IOHEXOL 300 MG/ML  SOLN 100  ml Omnipaque-300  Comparison: 07/24/2012  Findings: Lung bases:  Bibasilar opacities which are favored to represent areas of scarring and atelectasis.  Mild cardiomegaly with coronary artery atherosclerosis.  Small right pleural effusion is unchanged.  Abdomen/pelvis:  Mild cirrhosis suspected.  Mild intrahepatic biliary ductal dilatation which is unchanged.  Bilateral intrahepatic biliary stents, both terminating at the distal common duct.  There is surrounding amorphous soft tissue density in the region of the hepatic hilum and adjacent porta hepatis.  This measures 4.3 x 4.8 cm on image 32/series 2 and may be slightly increased since prior.  There is air in this region, increased, including on  image 31/series 2.  The branch portal veins are patent.  There is mass effect upon the portal vein in the region of the hepatic hilum on image 28/series 2.  No focal liver lesion.  Mild splenomegaly.  Normal stomach.  A descending duodenal diverticulum.  Normal pancreas, adrenal  glands.  Upper pole left renal too small to characterize lesions.  Nonaneurysmal infrarenal aortic dilatation. No retroperitoneal or retrocrural adenopathy.  Sigmoid diverticulosis with wall thickening, likely due to muscular hypertrophy.  Scattered diverticula throughout remainder of the colon.  Prominent fatty ileocecal valve. Appendix is not visualized but there is no evidence of right lower quadrant inflammation.  Normal small bowel loops.  Trace perihepatic ascites which is similar.  No well-defined peritoneal/omental metastasis.  No pelvic adenopathy.    Normal urinary bladder and prostate. Slight increase in the small volume cul-de-sac fluid, simple in appearance.  Bones/Musculoskeletal:  Left sacroiliac degenerative partial fusion.  IMPRESSION:  1.  Bilateral biliary stents in place.  Persistent mild intrahepatic ductal dilatation with similar to slight increase in amorphous soft tissue density in the region of the hepatic hilum. This is likely the site of the patient's reported cholangiocarcinoma.  There is gas in this area, adjacent to the biliary stents.  Although this could represent pneumobilia, tumor necrosis could have a similar appearance.  No evidence of drainable fluid collection or significant enhancement to suggest abscess. 2.  No other explanation for sepsis. 3.  Slight increase and the small volume pelvic ascites. 4.  Persistent small right pleural effusion with bibasilar opacities which are favored to represent areas of atelectasis or scar. 5.  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   Original Report Authenticated By: Jeronimo Greaves, M.D.     Medications: I have reviewed the patient's current medications.  Assessment/Plan: 1. Bacteremia with 2 organisms: Enterococcus and Streptococci secondary to contamination of stents.  S/P TEE on 08/12/2012 to evaluate for cardiac vegetations.  Report pending. On IV antibiotics. 2. Stage IV cholangiocarcinoma with  peritoneal metastases.  Non-operative candidate.  S/P metallic stent percutaneously at [facility] in March 2014 for obstruction.   LOS: 1 day    KEFALAS,THOMAS 08/12/2012

## 2012-08-12 NOTE — Progress Notes (Signed)
I have reviewed patient with Lucas Patton concerning TEE scheduled with Lucas Patton on 05/15/2012. There are no contraindications to TEE to include esophageal spasm, stricture, lacerations, or perforations. Lucas Patton does not have a hernia, cervical arthritis , pork since the radiation to the mediastinum. His neck had no history of upper GI bleeding. Lucas Patton will be kept n.p.o. after midnight. TEE is scheduled for 1:30 PM. The patient has been made aware of the procedure, and is willing to proceed.

## 2012-08-13 ENCOUNTER — Inpatient Hospital Stay (HOSPITAL_COMMUNITY): Payer: Medicare HMO

## 2012-08-13 ENCOUNTER — Encounter (HOSPITAL_COMMUNITY): Payer: Self-pay | Admitting: *Deleted

## 2012-08-13 ENCOUNTER — Encounter (HOSPITAL_COMMUNITY)
Admission: AD | Disposition: A | Discharge status: Home Health | Payer: Self-pay | Source: Ambulatory Visit | Attending: Internal Medicine

## 2012-08-13 DIAGNOSIS — I059 Rheumatic mitral valve disease, unspecified: Secondary | ICD-10-CM

## 2012-08-13 HISTORY — PX: TEE WITHOUT CARDIOVERSION: SHX5443

## 2012-08-13 LAB — COMPREHENSIVE METABOLIC PANEL
ALT: 58 U/L — ABNORMAL HIGH (ref 0–53)
AST: 48 U/L — ABNORMAL HIGH (ref 0–37)
Alkaline Phosphatase: 220 U/L — ABNORMAL HIGH (ref 39–117)
CO2: 28 mEq/L (ref 19–32)
Chloride: 98 mEq/L (ref 96–112)
Creatinine, Ser: 0.7 mg/dL (ref 0.50–1.35)
GFR calc non Af Amer: >90 mL/min (ref >90)
Potassium: 3.3 mEq/L — ABNORMAL LOW (ref 3.5–5.1)
Sodium: 132 mEq/L — ABNORMAL LOW (ref 135–145)
Total Bilirubin: 1.5 mg/dL — ABNORMAL HIGH (ref 0.3–1.2)

## 2012-08-13 LAB — CBC
HCT: 29 % — ABNORMAL LOW (ref 39.0–52.0)
MCH: 31.6 pg (ref 26.0–34.0)
MCHC: 35.9 g/dL (ref 30.0–36.0)
RDW: 14.8 % (ref 11.5–15.5)

## 2012-08-13 LAB — STREP PNEUMONIAE URINARY ANTIGEN: Strep Pneumo Urinary Antigen: NEGATIVE

## 2012-08-13 SURGERY — ECHOCARDIOGRAM, TRANSESOPHAGEAL
Anesthesia: Moderate Sedation

## 2012-08-13 MED ORDER — MIDAZOLAM HCL 5 MG/5ML IJ SOLN
INTRAMUSCULAR | Status: AC
  Filled 2012-08-13: qty 10

## 2012-08-13 MED ORDER — SODIUM CHLORIDE 0.9 % IV SOLN
INTRAVENOUS | Status: DC
  Administered 2012-08-13: 1000 mL via INTRAVENOUS

## 2012-08-13 MED ORDER — FENTANYL CITRATE 0.05 MG/ML IJ SOLN
INTRAMUSCULAR | Status: DC | PRN
  Administered 2012-08-13: 25 ug via INTRAVENOUS

## 2012-08-13 MED ORDER — POTASSIUM CHLORIDE 10 MEQ/100ML IV SOLN
INTRAVENOUS | Status: AC
  Administered 2012-08-13: 10 meq
  Filled 2012-08-13: qty 100

## 2012-08-13 MED ORDER — POTASSIUM CHLORIDE 10 MEQ/100ML IV SOLN: 10.0000 meq | INTRAVENOUS | Status: AC

## 2012-08-13 MED ORDER — BUTAMBEN-TETRACAINE-BENZOCAINE 2-2-14 % EX AERO
INHALATION_SPRAY | CUTANEOUS | Status: DC | PRN
  Administered 2012-08-13: 2 via TOPICAL

## 2012-08-13 MED ORDER — FENTANYL CITRATE 0.05 MG/ML IJ SOLN
INTRAMUSCULAR | Status: AC
  Filled 2012-08-13: qty 2

## 2012-08-13 MED ORDER — MIDAZOLAM HCL 5 MG/5ML IJ SOLN
INTRAMUSCULAR | Status: DC | PRN
  Administered 2012-08-13: 2 mg via INTRAVENOUS

## 2012-08-13 NOTE — Progress Notes (Addendum)
*  PRELIMINARY RESULTS* Echocardiogram Transesophageal echocardiogram performed 08/13/2012.  Lucas Patton 08/13/2012, 4:53 PM  Veda Canning, RDCS

## 2012-08-13 NOTE — Progress Notes (Signed)
TEE results noted; no valvular vegetations. Condition reviewed with Dr. Mike Gip of [facility]; no indication for ERCP. Patient aware. Will check LFTs in two days.

## 2012-08-13 NOTE — CV Procedure (Signed)
TEE without immediate complications. Patient tolerated well. Full results to follow as echocardiographic report. There were no valvular vegetations noted.  Jonelle Sidle, M.D., F.A.C.C.

## 2012-08-13 NOTE — Progress Notes (Addendum)
Subjective; Patient complains of lower back pain. He also complains of mild epigastric discomfort. He denies nausea vomiting chest pain or shortness of breath. Objective; BP 121/88  Pulse 72  Temp(Src) 98 F (36.7 C) (Oral)  Resp 20  Ht 6\' 2"  (1.88 m)  Wt 201 lb 12.8 oz (91.536 kg)  BMI 25.9 kg/m2  SpO2 98% Patient is alert and appears to be comfortable. Abdomen is flat with normal bowel sounds. It is soft with easily palpable tender left lobe of the liver. No LE edema noted. Lab data; WBC 3.8, H&H 10.4 and 29.0 and platelet count 142K Serum sodium 132, potassium 3.3, chloride 98, CO2 28, glucose 91, BUN 5, creatinine 0.70. Serum calcium 8.2. Bilirubin 1.5, AP 220, AST 48, ALT 58 and albumin 2.4 Assessment; Acute cholangitis. Patient responding to the therapy. Bilirubin transaminases are and history of present illness all down. Enterococcus is commonly associated with cholangitis and group B streptococcus less likely. Patient is scheduled to undergo TEE to rule out endocarditis. Since bili and transaminases are coming down he may not need an ERCP. Nevertheless I will talk with Dr. Shana Chute of [facility] later today. If TEE is negative and sepsis presumed to be secondary to cholangitis he will need at least 10 days of antibiotics. Metastatic cholangiocarcinoma. Chemotherapy on hold secondary to infection.

## 2012-08-13 NOTE — Clinical Documentation Improvement (Signed)
MALNUTRITION DOCUMENTATION CLARIFICATION  THIS DOCUMENT IS NOT A PERMANENT PART OF THE MEDICAL RECORD  TO RESPOND TO THE THIS QUERY, FOLLOW THE INSTRUCTIONS BELOW:  1. If needed, update documentation for the patient's encounter via the notes activity.  2. Access this query again and click edit on the In [employer].  3. After updating, or not, click F2 to complete all highlighted (required) fields concerning your review. Select "additional documentation in the medical record" OR "no additional documentation provided".  4. Click Sign note button.  5. The deficiency will fall out of your In Basket *Please let us know if you are not able to complete this workflow by phone or e-mail (listed below).  Please update your documentation within the medical record to reflect your response to this query.                                                                                        08/13/12   Dear Dr.Carlen Fils / Associates,  In a better effort to capture your patient's severity of illness, reflect appropriate length of stay and utilization of resources, a review of the patient medical record has revealed the following indicators.    Based on your clinical judgment, please clarify and document in a progress note and/or discharge summary the clinical condition associated with the following supporting information:  In responding to this query please exercise your independent judgment.  The fact that a query is asked, does not imply that any particular answer is desired or expected. Possible Clinical Conditions?  Mild Malnutrition  Moderate Malnutrition Severe Malnutrition   Protein Calorie Malnutrition Severe Protein Calorie Malnutrition Other Condition________________ Cannot clinically determine   Supporting Information: Risk Factors:  Per RD assessment: Pt meets criteria for severe malnutrition in the context of chronic disease based on 29 pound weight loss (13% body weight) in 6  months and >/=75% estimated energy intake for >/=1 month.  metastatic cholangiocarcinoma, under chemotherapy  Admitted with bacteremia/pneumonia  Signs & Symptoms: Ht 6'2"     Wt 201 lbs  BMI:  25  Unintentional Weight  Loss of  29 lbs in 6 months.   Treatment  Ensure pudding tid between meals Daily weights  Nutrition Consult NUTRITION DIAGNOSIS:  Unintentional weight loss related to decreased appetite as evidenced by 29 pound weight loss (13% body weight) in 6 months.  Goal:  Intake to meet >/=90% estimated nutrition needs  Monitor:  Tolerance and acceptance of Magic Cup, PO's, wt trends, and labs INTERVENTION:  Magic Cup BID which will provide 580 kcal and 18 grams of protein  Highly recommend an appetite stimulant   You may use possible, probable, or suspect with inpatient documentation. possible, probable, suspected diagnoses MUST be documented at the time of discharge  Reviewed: additional documentation in the medical record  Thank You,  Debora T Williams  RN, MSN Clinical Documentation Specialist: Office# [phone] [facility] Health Information Management [place]

## 2012-08-13 NOTE — Progress Notes (Signed)
Dr Diona Browner aware of patient having portacath. Verbal order ok to use for TEE

## 2012-08-13 NOTE — Progress Notes (Signed)
Discussed with Drs. Neijstrom and Rehman  Subjective: Feels a little better.  Nausea better. Eating a bit.  No pain.  Chronic constipation and cold intolerance  Objective: Vital signs in last 24 hours: Filed Vitals:   08/13/12 1350 08/13/12 1355 08/13/12 1400 08/13/12 1405  BP: 128/58 103/72 112/67 115/50  Pulse: 77 76 77 74  Temp:      TempSrc:      Resp: 12 16 15 18   Height:      Weight:      SpO2: 98% 98% 98% 98%   Weight change:   Intake/Output Summary (Last 24 hours) at 08/13/12 1903 Last data filed at 08/13/12 1800  Gross per 24 hour  Intake    120 ml  Output   2200 ml  Net  -2080 ml   Gen:  Non-toxic in chair. HEENT:  Scleral icterus LUngs CTA without WRR CV RRR without MGR Abd s,nt, nd Ext:  Minimal edema Skin:  Mild jaundice. Skin dry  Lab Results: Basic Metabolic Panel:  Recent Labs Lab 08/12/12 1111 08/13/12 0517  NA 130* 132*  K 3.8 3.3*  CL 96 98  CO2 27 28  GLUCOSE 116* 91  BUN 6 5*  CREATININE 0.72 0.70  CALCIUM 8.5 8.2*   Liver Function Tests:  Recent Labs Lab 08/12/12 1111 08/13/12 0517  AST 67* 48*  ALT 71* 58*  ALKPHOS 240* 220*  BILITOT 2.5* 1.5*  PROT 6.3 5.8*  ALBUMIN 2.6* 2.4*   No results found for this basename: LIPASE, AMYLASE,  in the last 168 hours No results found for this basename: AMMONIA,  in the last 168 hours CBC:  Recent Labs Lab 08/07/12 0823 08/11/12 1605 08/13/12 0517  WBC 8.7 5.2 3.8*  NEUTROABS 8.0* 3.3  --   HGB 12.0* 10.8* 10.4*  HCT 34.6* 30.7* 29.0*  MCV 88.9 87.0 88.1  PLT 45* 113* 142*  Thyroid Function Tests:  Recent Labs Lab 08/11/12 1606  TSH 12.163*   Coagulation: No results found for this basename: LABPROT, INR,  in the last 168 hours Anemia Panel:  Recent Labs Lab 08/07/12 0823  VITAMINB12 560  FOLATE 12.5   Urine Drug Screen: Drugs of Abuse  No results found for this basename: labopia,  cocainscrnur,  labbenz,  amphetmu,  thcu,  labbarb    Alcohol Level: No results  found for this basename: ETH,  in the last 168 hours Urinalysis:  Recent Labs Lab 08/07/12 1130  COLORURINE AMBER*  LABSPEC 1.010  PHURINE 5.5  GLUCOSEU NEGATIVE  HGBUR NEGATIVE  BILIRUBINUR SMALL*  KETONESUR TRACE*  PROTEINUR TRACE*  UROBILINOGEN 0.2  NITRITE NEGATIVE  LEUKOCYTESUR NEGATIVE   Micro Results: Recent Results (from the past 240 hour(s))  CULTURE, BLOOD (ROUTINE X 2)     Status: None   Collection Time    08/07/12 10:00 AM      Result Value Range Status   Specimen Description BLOOD PORTA CATH DRAWN BY RN WF   Final   Special Requests     Final   Value: BOTTLES DRAWN AEROBIC AND ANAEROBIC AEB=8CC ANA=6CC   Culture  Setup Time 08/09/2012 21:10   Final   Culture     Final   Value: STREPTOCOCCUS GROUP D;high probability for S.bovis     Note: Gram Stain Report Called to,Read Back By and Verified With: K. NEWMAN AT 1930 ON 08/08/12 BY Wynonia Lawman Performed at [facility]   Report Status 08/10/2012 FINAL   Final   Organism ID, Bacteria  STREPTOCOCCUS GROUP D;high probability for S.bovis   Final  CULTURE, BLOOD (ROUTINE X 2)     Status: None   Collection Time    08/07/12 10:10 AM      Result Value Range Status   Specimen Description BLOOD RIGHT ARM   Final   Special Requests BOTTLES DRAWN AEROBIC AND ANAEROBIC 12CC   Final   Culture  Setup Time 08/08/2012 14:05   Final   Culture     Final   Value: ENTEROCOCCUS SPECIES     Note: PREVIOUSLY REPORTED AS Gram Positive Rods Gram Stain Report Called to,Read Back By and Verified With: NEWMAN K @ FLOW MANAGEMENT [id number] BY FORSYTH K CORRECTED RESULTS CALLED TO: PAULA HAMLETT @ 1517 08/08/12 BY KRAWS COMBINATION THERAPY OF HIGH      DOSE AMPICILLIN OR VANCOMYCIN, PLUS AN AMINOGLYCOSIDE, IS USUALLY INDICATED FOR SERIOUS ENTEROCOCCAL INFECTIONS.   Report Status 08/10/2012 FINAL   Final   Organism ID, Bacteria ENTEROCOCCUS SPECIES   Final  CULTURE, BLOOD (ROUTINE X 2)     Status: None   Collection Time    08/11/12   2:08 PM      Result Value Range Status   Specimen Description Blood BLOOD RIGHT ARM   Final   Special Requests NONE 10 CC EACH   Final   Culture PENDING   Incomplete   Report Status PENDING   Incomplete   Studies/Results: Dg Chest 2 View  08/11/2012  *RADIOLOGY REPORT*  Clinical Data: Fever.  Bacteremia.  CHEST - 2 VIEW  Comparison: 08/07/2012  Findings: Mild hyperinflation.  Left-sided Port-A-Cath which terminates at the mid SVC.  Midline trachea.  Normal heart size and mediastinal contours for age.  No pleural effusion or pneumothorax.  Right greater than left upper lobe predominant scarring, with calcified granulomas bilaterally.  Right base opacity is similar back to 02/18/2012, favored to represent an area of scarring.  There is patchy left base pulmonary opacity which is similar to on the prior exam. Possibly increased since 02/18/2012 (especially on the lateral view).  IMPRESSION: Hyperinflation with extensive bilateral scarring.  Left base opacity which may be slightly increased since 02/18/2012 but is similar to 08/07/2012.  Either progressive scarring or mild superimposed infection/aspiration.   Original Report Authenticated By: Jeronimo Greaves, M.D.    Ct Abdomen Pelvis W Contrast  08/11/2012  *RADIOLOGY REPORT*  Clinical Data: Sepsis.  Cholangiocarcinoma with stent.  Question infection/abscess.  Fever and abdominal pain.  CT ABDOMEN AND PELVIS WITH CONTRAST  Technique:  Multidetector CT imaging of the abdomen and pelvis was performed following the standard protocol during bolus administration of intravenous contrast.  Contrast: 1 OMNIPAQUE IOHEXOL 300 MG/ML  SOLN, 1 OMNIPAQUE IOHEXOL 300 MG/ML  SOLN 100  ml Omnipaque-300  Comparison: 07/24/2012  Findings: Lung bases:  Bibasilar opacities which are favored to represent areas of scarring and atelectasis.  Mild cardiomegaly with coronary artery atherosclerosis.  Small right pleural effusion is unchanged.  Abdomen/pelvis:  Mild cirrhosis suspected.   Mild intrahepatic biliary ductal dilatation which is unchanged.  Bilateral intrahepatic biliary stents, both terminating at the distal common duct.  There is surrounding amorphous soft tissue density in the region of the hepatic hilum and adjacent porta hepatis.  This measures 4.3 x 4.8 cm on image 32/series 2 and may be slightly increased since prior.  There is air in this region, increased, including on image 31/series 2.  The branch portal veins are patent.  There is mass effect upon the portal vein in  the region of the hepatic hilum on image 28/series 2.  No focal liver lesion.  Mild splenomegaly.  Normal stomach.  A descending duodenal diverticulum.  Normal pancreas, adrenal glands.  Upper pole left renal too small to characterize lesions.  Nonaneurysmal infrarenal aortic dilatation. No retroperitoneal or retrocrural adenopathy.  Sigmoid diverticulosis with wall thickening, likely due to muscular hypertrophy.  Scattered diverticula throughout remainder of the colon.  Prominent fatty ileocecal valve. Appendix is not visualized but there is no evidence of right lower quadrant inflammation.  Normal small bowel loops.  Trace perihepatic ascites which is similar.  No well-defined peritoneal/omental metastasis.  No pelvic adenopathy.    Normal urinary bladder and prostate. Slight increase in the small volume cul-de-sac fluid, simple in appearance.  Bones/Musculoskeletal:  Left sacroiliac degenerative partial fusion.  IMPRESSION:  1.  Bilateral biliary stents in place.  Persistent mild intrahepatic ductal dilatation with similar to slight increase in amorphous soft tissue density in the region of the hepatic hilum. This is likely the site of the patient's reported cholangiocarcinoma.  There is gas in this area, adjacent to the biliary stents.  Although this could represent pneumobilia, tumor necrosis could have a similar appearance.  No evidence of drainable fluid collection or significant enhancement to suggest  abscess. 2.  No other explanation for sepsis. 3.  Slight increase and the small volume pelvic ascites. 4.  Persistent small right pleural effusion with bibasilar opacities which are favored to represent areas of atelectasis or scar. 5.  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   Original Report Authenticated By: Jeronimo Greaves, M.D.    Scheduled Meds: . ampicillin (OMNIPEN) IV  2 g Intravenous Q4H  . apixaban  2.5 mg Oral BID  . aspirin EC  81 mg Oral BID  . feeding supplement  1 Container Oral TID BM  . gentamicin  90 mg Intravenous Q8H  . hydrocerin   Topical BID  . levothyroxine  200 mcg Oral QAC breakfast  . multivitamin with minerals  1 tablet Oral Daily  . omega-3 acid ethyl esters  2 g Oral BID  . pantoprazole  40 mg Oral Daily  . senna  1 tablet Oral Daily  . traZODone  100 mg Oral QHS   Continuous Infusions: . sodium chloride 50 mL/hr at 08/12/12 2224  . sodium chloride 1,000 mL (08/13/12 1317)   PRN Meds:.acetaminophen, acetaminophen, alum & mag hydroxide-simeth, lactulose, LORazepam, morphine injection, ondansetron (ZOFRAN) IV, ondansetron, oxyCODONE, simethicone Assessment/Plan: Principal Problem:   Bacteremia, enterococcus and strep bovis:  TransTHORACIC echo pending. For Transesophageal echo tomorrow to asses for valvulare vegetation.  If LFTs worsen, will require transfer to [facility] for biliary stent evaluation (may be infected).  Continue current abx.  Pneumonia:  Received several doses of levaquin.  Check urine legionella and strep pneumonia    HTN (hypertension)    Cholangiocarcinoma with peritoneal mets:  No chemo until infection worked up and stabilized.    Hyponatremia secondary to N/V, cancer and hyponatremia. Improved after saline    CAD S/P percutaneous coronary angioplasty    Hypothyroidism:  TSH high.  Will increase synthroid to 200 mcg    HLD (hyperlipidemia)    Weight loss, unintentional    Psoriasis    DVT (deep  venous thrombosis)   LOS: 2 days   Lucas Patton 08/13/2012, 7:03 PM

## 2012-08-13 NOTE — Progress Notes (Signed)
ANTIBIOTIC CONSULT NOTE - INITIAL  Pharmacy Consult for Ampicillin and Gentamicin Indication: bacteremia with presumed endocarditis (S. Bovis)  Allergies  Allergen Reactions  . Metoprolol Other (See Comments)    depression   Patient Measurements: Height: 6\' 2"  (188 cm) Weight: 201 lb (91.173 kg) IBW/kg (Calculated) : 82.2 6\' 2"   Vital Signs: Temp: 98.4 F (36.9 C) (04/23 1313) Temp src: Oral (04/23 1313) BP: 115/50 mmHg (04/23 1405) Pulse Rate: 74 (04/23 1405) Intake/Output from previous day: 04/22 0701 - 04/23 0700 In: 720 [P.O.:720] Out: 2500 [Urine:2500] Intake/Output from this shift:    Labs:  Recent Labs  08/11/12 1605 08/12/12 1111 08/13/12 0517  WBC 5.2  --  3.8*  HGB 10.8*  --  10.4*  PLT 113*  --  142*  CREATININE 0.62 0.72 0.70   Estimated Creatinine Clearance: 104.2 ml/min (by C-G formula based on Cr of 0.7).  Recent Labs  08/13/12 0920  GENTTROUGH 0.8     Microbiology: Recent Results (from the past 720 hour(s))  SURGICAL PCR SCREEN     Status: None   Collection Time    07/28/12  6:22 AM      Result Value Range Status   MRSA, PCR NEGATIVE  NEGATIVE Final   Staphylococcus aureus NEGATIVE  NEGATIVE Final   Comment:            The Xpert SA Assay (FDA     approved for NASAL specimens     in patients over 55 years of age),     is one component of     a comprehensive surveillance     program.  Test performance has     been validated by [employer] for patients greater     than or equal to 42 year old.     It is not intended     to diagnose infection nor to     guide or monitor treatment.  CULTURE, BLOOD (ROUTINE X 2)     Status: None   Collection Time    08/07/12 10:00 AM      Result Value Range Status   Specimen Description BLOOD PORTA CATH DRAWN BY RN WF   Final   Special Requests     Final   Value: BOTTLES DRAWN AEROBIC AND ANAEROBIC AEB=8CC ANA=6CC   Culture  Setup Time 08/09/2012 21:10   Final   Culture     Final   Value:  STREPTOCOCCUS GROUP D;high probability for S.bovis     Note: Gram Stain Report Called to,Read Back By and Verified With: K. NEWMAN AT 1930 ON 08/08/12 BY Wynonia Lawman Performed at [facility]   Report Status 08/10/2012 FINAL   Final   Organism ID, Bacteria STREPTOCOCCUS GROUP D;high probability for S.bovis   Final  CULTURE, BLOOD (ROUTINE X 2)     Status: None   Collection Time    08/07/12 10:10 AM      Result Value Range Status   Specimen Description BLOOD RIGHT ARM   Final   Special Requests BOTTLES DRAWN AEROBIC AND ANAEROBIC 12CC   Final   Culture  Setup Time 08/08/2012 14:05   Final   Culture     Final   Value: ENTEROCOCCUS SPECIES     Note: PREVIOUSLY REPORTED AS Gram Positive Rods Gram Stain Report Called to,Read Back By and Verified With: NEWMAN K @ FLOW MANAGEMENT [id number] BY FORSYTH K CORRECTED RESULTS CALLED TO: PAULA HAMLETT @ 1517 08/08/12 BY KRAWS COMBINATION  THERAPY OF HIGH      DOSE AMPICILLIN OR VANCOMYCIN, PLUS AN AMINOGLYCOSIDE, IS USUALLY INDICATED FOR SERIOUS ENTEROCOCCAL INFECTIONS.   Report Status 08/10/2012 FINAL   Final   Organism ID, Bacteria ENTEROCOCCUS SPECIES   Final  CULTURE, BLOOD (ROUTINE X 2)     Status: None   Collection Time    08/11/12  2:08 PM      Result Value Range Status   Specimen Description Blood BLOOD RIGHT ARM   Final   Special Requests NONE 10 CC EACH   Final   Culture PENDING   Incomplete   Report Status PENDING   Incomplete   Medical History: Past Medical History  Diagnosis Date  . Hypertension   . Thyroid disease   . Hypercholesteremia   . Hypothyroidism   . Myocardial infarction   . Cancer     Choleangiocarcinoma   Medications:  Scheduled:  . ampicillin (OMNIPEN) IV  2 g Intravenous Q4H  . apixaban  2.5 mg Oral BID  . aspirin EC  81 mg Oral BID  . feeding supplement  1 Container Oral TID BM  . fentaNYL      . gentamicin  90 mg Intravenous Q8H  . hydrocerin   Topical BID  . levothyroxine  200 mcg Oral QAC  breakfast  . midazolam      . multivitamin with minerals  1 tablet Oral Daily  . omega-3 acid ethyl esters  2 g Oral BID  . pantoprazole  40 mg Oral Daily  . [EXPIRED] potassium chloride  10 mEq Intravenous Q1 Hr x 2  . [COMPLETED] potassium chloride      . [COMPLETED] potassium chloride      . senna  1 tablet Oral Daily  . traZODone  100 mg Oral QHS  . [DISCONTINUED] lactulose  20 g Oral Daily  . [DISCONTINUED] levothyroxine  175 mcg Oral QAC breakfast   Assessment: 67yo male admitted with enterococcus & S. bovis bacteremia (TTE pending) on day#2 ampicillin & gentamicin.   Renal function has been stable.  Gentamicin trough at goal.    Goal of Therapy:  Gentamicin trough level < 1 mcg/ml  Plan: Ampicillin 2gm IV q4hrs (12gm per day) Gentamicin 90mg  IV q8hrs for synergy (per AHA guidelines) Monitor labs, renal fxn, and cultures Duration of therapy per MD   Elson Clan 08/13/2012,2:41 PM

## 2012-08-14 ENCOUNTER — Encounter (HOSPITAL_COMMUNITY): Payer: Self-pay | Admitting: Cardiology

## 2012-08-14 DIAGNOSIS — I82409 Acute embolism and thrombosis of unspecified deep veins of unspecified lower extremity: Secondary | ICD-10-CM

## 2012-08-14 LAB — LEGIONELLA ANTIGEN, URINE

## 2012-08-14 MED ORDER — LEVOTHYROXINE SODIUM 200 MCG PO TABS: 200.0000 ug | ORAL_TABLET | Freq: Every day | ORAL | Status: DC

## 2012-08-14 MED ORDER — LACTULOSE 10 GM/15ML PO SOLN: 30.0000 mL | Freq: Every day | ORAL | Status: DC | PRN

## 2012-08-14 MED ORDER — SENNA 8.6 MG PO TABS: 1.0000 | ORAL_TABLET | Freq: Every day | ORAL | Status: AC | PRN

## 2012-08-14 MED ORDER — GENTAMICIN SULFATE 40 MG/ML IJ SOLN: 90.0000 mg | Freq: Three times a day (TID) | INTRAVENOUS | Status: DC

## 2012-08-14 MED ORDER — SODIUM CHLORIDE 0.9 % IV SOLN: 2.0000 g | INTRAVENOUS | Status: DC

## 2012-08-14 NOTE — Discharge Summary (Signed)
Physician Discharge Summary  Patient ID: Lucas Patton MRN: [id number] DOB/AGE: 10/30/1945 67 y.o.  Admit date: 08/11/2012 Discharge date: 08/14/2012  Discharge Diagnoses:  Principal Problem:   Bacteremia Active Problems:   Enterococcal septicemia   Streptococcus bovis infection   HTN (hypertension)   Malignant neoplasm of intrahepatic bile ducts   Hyponatremia   CAD S/P percutaneous coronary angioplasty   Hypothyroidism   HLD (hyperlipidemia)   Weight loss, unintentional   Psoriasis   DVT (deep venous thrombosis)  severe protein calorie malnutrition    Medication List    STOP taking these medications       levofloxacin 750 MG tablet  Commonly known as:  LEVAQUIN     lidocaine-prilocaine cream  Commonly known as:  EMLA      TAKE these medications       apixaban 2.5 MG Tabs tablet  Commonly known as:  ELIQUIS  Take 2.5 mg by mouth 2 (two) times daily. To start after port placed on Wed or Thurs     aspirin EC 81 MG tablet  Take 81 mg by mouth 2 (two) times daily. To start after port-a-cath insertion      gentamicin 40 MG/ML SOLN 90 mg  Inject 90 mg into the vein every 8 (eight) hours. Until 5/6. Pharmacy to assist with dosing     fish oil-omega-3 fatty acids 1000 MG capsule  Take 2 g by mouth 2 (two) times daily.     lactulose 10 GM/15ML solution  Commonly known as:  CHRONULAC  Take 30 mLs (20 g total) by mouth daily as needed. constipation     levothyroxine 200 MCG tablet  Commonly known as:  SYNTHROID, LEVOTHROID  Take 1 tablet (200 mcg total) by mouth daily before breakfast.     LORazepam 1 MG tablet  Commonly known as:  ATIVAN  Take by mouth. Take 1/2 tablet to 1 tablet every 4 hours IF needed for nausea/vomiting. Side Effect: Drowsiness/Sleepiness. Do not drive while taking.     multivitamin capsule  Take 1 capsule by mouth daily.     omeprazole 20 MG capsule  Commonly known as:  PRILOSEC  Take 20 mg by mouth 2 (two) times daily.     ondansetron  8 MG tablet  Commonly known as:  ZOFRAN  Take 8 mg by mouth. Starting the 3rd day after chemo, may take 1 tablet every 8 hours IF needed for nausea/vomiting.     oxycodone 5 MG capsule  Commonly known as:  OXY-IR  Take 5 mg by mouth every 4 (four) hours as needed for pain.     senna 8.6 MG Tabs  Commonly known as:  SENOKOT  Take 1 tablet (8.6 mg total) by mouth daily as needed.      ampicillin 2 G SOLR 2 g  Inject 2 g into the vein every 4 (four) hours. Until 5/6     traZODone 100 MG tablet  Commonly known as:  DESYREL  Take 100 mg by mouth at bedtime.            Discharge Orders   Future Appointments Provider Department Dept Phone   08/19/2012 9:15 AM [facility] Team B [facility] [phone]   08/26/2012 9:15 AM [facility] Team B [facility] [phone]   08/27/2012 1:30 PM [facility] Chair 7 [facility] [phone]   Future Orders Complete By Expires     Activity as tolerated - No restrictions  As directed  Diet general  As directed        Disposition: 01-Home or Self Care  Discharged Condition: Stable  Consults: Treatment Team:  Malissa Hippo, MD Randall An, MD Cardiology  Labs:   Results for orders placed during the hospital encounter of 08/11/12 (from the past 48 hour(s))  LEGIONELLA ANTIGEN, URINE     Status: None   Collection Time    08/13/12  1:23 AM      Result Value Range   Specimen Description URINE, RANDOM     Special Requests NONE     Legionella Antigen, Urine Negative for Legionella pneumophilia serogroup 1     Report Status 08/14/2012 FINAL    STREP PNEUMONIAE URINARY ANTIGEN     Status: None   Collection Time    08/13/12  1:23 AM      Result Value Range   Strep Pneumo Urinary Antigen NEGATIVE  NEGATIVE   Comment:            Infection due to S. pneumoniae     cannot be absolutely ruled out     since the antigen present     may be below the detection limit     of the test.  CBC     Status:  Abnormal   Collection Time    08/13/12  5:17 AM      Result Value Range   WBC 3.8 (*) 4.0 - 10.5 K/uL   RBC 3.29 (*) 4.22 - 5.81 MIL/uL   Hemoglobin 10.4 (*) 13.0 - 17.0 g/dL   HCT 62.9 (*) 52.8 - 41.3 %   MCV 88.1  78.0 - 100.0 fL   MCH 31.6  26.0 - 34.0 pg   MCHC 35.9  30.0 - 36.0 g/dL   RDW 24.4  01.0 - 27.2 %   Platelets 142 (*) 150 - 400 K/uL  COMPREHENSIVE METABOLIC PANEL     Status: Abnormal   Collection Time    08/13/12  5:17 AM      Result Value Range   Sodium 132 (*) 135 - 145 mEq/L   Potassium 3.3 (*) 3.5 - 5.1 mEq/L   Chloride 98  96 - 112 mEq/L   CO2 28  19 - 32 mEq/L   Glucose, Bld 91  70 - 99 mg/dL   BUN 5 (*) 6 - 23 mg/dL   Creatinine, Ser 5.36  0.50 - 1.35 mg/dL   Calcium 8.2 (*) 8.4 - 10.5 mg/dL   Total Protein 5.8 (*) 6.0 - 8.3 g/dL   Albumin 2.4 (*) 3.5 - 5.2 g/dL   AST 48 (*) 0 - 37 U/L   ALT 58 (*) 0 - 53 U/L   Alkaline Phosphatase 220 (*) 39 - 117 U/L   Total Bilirubin 1.5 (*) 0.3 - 1.2 mg/dL   GFR calc non Af Amer >90  >90 mL/min   GFR calc Af Amer >90  >90 mL/min   Comment:            The eGFR has been calculated     using the CKD EPI equation.     This calculation has not been     validated in all clinical     situations.     eGFR's persistently     <90 mL/min signify     possible Chronic Kidney Disease.  GENTAMICIN LEVEL, TROUGH     Status: None   Collection Time    08/13/12  9:20 AM      Result Value  Range   Gentamicin Trough 0.8  0.5 - 2.0 ug/mL    Diagnostics:  Dg Chest 2 View  08/11/2012  *RADIOLOGY REPORT*  Clinical Data: Fever.  Bacteremia.  CHEST - 2 VIEW  Comparison: 08/07/2012  Findings: Mild hyperinflation.  Left-sided Port-A-Cath which terminates at the mid SVC.  Midline trachea.  Normal heart size and mediastinal contours for age.  No pleural effusion or pneumothorax.  Right greater than left upper lobe predominant scarring, with calcified granulomas bilaterally.  Right base opacity is similar back to 02/18/2012, favored to  represent an area of scarring.  There is patchy left base pulmonary opacity which is similar to on the prior exam. Possibly increased since 02/18/2012 (especially on the lateral view).  IMPRESSION: Hyperinflation with extensive bilateral scarring.  Left base opacity which may be slightly increased since 02/18/2012 but is similar to 08/07/2012.  Either progressive scarring or mild superimposed infection/aspiration.   Original Report Authenticated By: Jeronimo Greaves, M.D.    Dg Chest 2 View  08/07/2012  *RADIOLOGY REPORT*  Clinical Data: Fever, cough, possible pneumonia  CHEST - 2 VIEW  Comparison: 07/28/2012  Findings: Cardiomediastinal silhouette is stable.  Left subclavian Port-A-Cath is unchanged in position.  There is no pulmonary edema. Calcified granulomas bilaterally again noted.  There is streaky left basilar atelectasis or infiltrate.  Question small left pleural effusion.  IMPRESSION: No pulmonary edema.  Streaky left basilar atelectasis or early infiltrate.  Probable small left pleural effusion.   Original Report Authenticated By: Natasha Mead, M.D.    Ct Abdomen Pelvis W Contrast  08/11/2012  *RADIOLOGY REPORT*  Clinical Data: Sepsis.  Cholangiocarcinoma with stent.  Question infection/abscess.  Fever and abdominal pain.  CT ABDOMEN AND PELVIS WITH CONTRAST  Technique:  Multidetector CT imaging of the abdomen and pelvis was performed following the standard protocol during bolus administration of intravenous contrast.  Contrast: 1 OMNIPAQUE IOHEXOL 300 MG/ML  SOLN, 1 OMNIPAQUE IOHEXOL 300 MG/ML  SOLN 100  ml Omnipaque-300  Comparison: 07/24/2012  Findings: Lung bases:  Bibasilar opacities which are favored to represent areas of scarring and atelectasis.  Mild cardiomegaly with coronary artery atherosclerosis.  Small right pleural effusion is unchanged.  Abdomen/pelvis:  Mild cirrhosis suspected.  Mild intrahepatic biliary ductal dilatation which is unchanged.  Bilateral intrahepatic biliary stents, both  terminating at the distal common duct.  There is surrounding amorphous soft tissue density in the region of the hepatic hilum and adjacent porta hepatis.  This measures 4.3 x 4.8 cm on image 32/series 2 and may be slightly increased since prior.  There is air in this region, increased, including on image 31/series 2.  The branch portal veins are patent.  There is mass effect upon the portal vein in the region of the hepatic hilum on image 28/series 2.  No focal liver lesion.  Mild splenomegaly.  Normal stomach.  A descending duodenal diverticulum.  Normal pancreas, adrenal glands.  Upper pole left renal too small to characterize lesions.  Nonaneurysmal infrarenal aortic dilatation. No retroperitoneal or retrocrural adenopathy.  Sigmoid diverticulosis with wall thickening, likely due to muscular hypertrophy.  Scattered diverticula throughout remainder of the colon.  Prominent fatty ileocecal valve. Appendix is not visualized but there is no evidence of right lower quadrant inflammation.  Normal small bowel loops.  Trace perihepatic ascites which is similar.  No well-defined peritoneal/omental metastasis.  No pelvic adenopathy.    Normal urinary bladder and prostate. Slight increase in the small volume cul-de-sac fluid, simple in appearance.  Bones/Musculoskeletal:  Left sacroiliac degenerative partial fusion.  IMPRESSION:  1.  Bilateral biliary stents in place.  Persistent mild intrahepatic ductal dilatation with similar to slight increase in amorphous soft tissue density in the region of the hepatic hilum. This is likely the site of the patient's reported cholangiocarcinoma.  There is gas in this area, adjacent to the biliary stents.  Although this could represent pneumobilia, tumor necrosis could have a similar appearance.  No evidence of drainable fluid collection or significant enhancement to suggest abscess. 2.  No other explanation for sepsis. 3.  Slight increase and the small volume pelvic ascites. 4.   Persistent small right pleural effusion with bibasilar opacities which are favored to represent areas of atelectasis or scar. 5.  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   Original Report Authenticated By: Jeronimo Greaves, M.D.    Ct Abdomen Pelvis W Contrast  07/24/2012  *RADIOLOGY REPORT*  Clinical Data: Newly diagnosed cholangiocarcinoma  CT ABDOMEN AND PELVIS WITH CONTRAST  Technique:  Multidetector CT imaging of the abdomen and pelvis was performed following the standard protocol during bolus administration of intravenous contrast.  Contrast: OMNIPAQUE IOHEXOL 300 MG/ML  SOLN  Comparison: MRI abdomen dated 02/20/2012  Findings: Trace right pleural effusion.  Associated right basilar opacity, likely atelectasis.  Liver is notable for a mildly nodular hepatic contour.  Indwelling biliary stents with surrounding abnormal soft tissue (series 2/image 26), more conspicuous than on prior MRI, corresponding to known cholangiocarcinoma.  Spleen is mildly enlarged, measuring 13.9 cm in craniocaudal dimension.  Pancreas and adrenal glands within normal limits.  Gallbladder is underdistended.  Stable mild intrahepatic ductal dilatation.  Kidneys are within normal limits.  No hydronephrosis.  No evidence of bowel obstruction.  Colonic diverticulosis, without associated inflammatory changes.  Atherosclerotic calcifications of the abdominal aorta and branch vessels.  Portal vein is patent.  Trace abdominopelvic ascites.  Mild stranding anterior to the left hepatic lobe without definite peritoneal nodularity.  No suspicious abdominopelvic lymphadenopathy.  Degenerative changes of the visualized thoracolumbar spine.  IMPRESSION: Biliary stents with surrounding abnormal soft tissue, more conspicuous than on prior MRI, corresponding to known cholangiocarcinoma.  Associated stable mild intrahepatic ductal dilatation.  Mild stranding anterior to the left hepatic lobe, nonspecific, without  definite peritoneal nodularity.  Attention on follow-up is suggested.  Otherwise, no definite findings of metastatic disease.  Mildly nodular hepatic contour borderline splenomegaly.  Correlate for cirrhosis.   Original Report Authenticated By: Charline Bills, M.D.    US Venous Img Lower Bilateral  07/23/2012  *RADIOLOGY REPORT*  Clinical Data: Cholangiocarcinoma with bilateral leg edema.  VENOUS DUPLEX ULTRASOUND OF BILATERAL LOWER EXTREMITIES  Technique: Gray-scale sonography with graded compression, as well as color Doppler and duplex ultrasound, were performed to evaluate the deep venous system of both lower extremities from the level of the common femoral vein through the popliteal and proximal calf veins. Spectral Doppler was utilized to evaulate flow at rest and with distal augmentation maneuvers.  Comparison: No comparison studies available.  Findings: There is some adherent clot identified in the right common femoral vein which prohibits complete compression of the vein.  There is clear flow around the adherent thrombus.  The superficial femoral vein, profunda femoral vein, saphenofemoral junction, and popliteal vein show normal patent directional flow and compressibility.  The posterior tibial veins appear patent where visualized below the knee.  On the left, the common femoral vein, superficial femoral vein, profunda femoral vein, saphenofemoral junction, and popliteal vein show  normal patent directional flow, normal phasicity, normal augmentation, normal compression.  Posterior tibial veins appear patent where visualized below the knee.  The patient is noted to have subcutaneous edema within the legs bilaterally.  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 The relatively small amount of chronic thrombus in the vessel may not be flow-limiting. There is no evidence for acute or occlusive DVT in either lower extremity.  I discussed  these findings by telephone with Dr.Neijstrom at approximately 0930 hours on 07/23/2012.   Original Report Authenticated By: Kennith Center, M.D.    Dg Chest Port 1 View  07/28/2012  *RADIOLOGY REPORT*  Clinical Data: Port-A-Cath insertion.  Moderately differentiated metastatic cholangiocarcinoma.  PORTABLE CHEST - 1 VIEW  Comparison: 02/18/2012  Findings: A left-sided Port-A-Cath which terminates at the mid SVC. Mild angulation, possibly at the site of initial tunneling.  No pneumothorax.  Midline trachea.  Normal heart size.  No pleural fluid.  Mild bibasilar volume loss.  Scattered calcified granuloma suspected bilaterally.  IMPRESSION: Left-sided Port-A-Cath terminating at mid SVC, without pneumothorax.   Original Report Authenticated By: Jeronimo Greaves, M.D.    Dg C-arm 1-60 Min-no Report  07/28/2012  CLINICAL DATA: cholangio carcinomam   C-ARM 1-60 MINUTES  Fluoroscopy was utilized by the requesting physician.  No radiographic  interpretation.      Procedures: Transesophageal echocardiogram  - Left ventricle: Systolic function was normal. The estimated ejection fraction was in the range of 55% to 60%. - Aortic valve: Trivial regurgitation. - Mitral valve: Mildly calcified annulus. Mild regurgitation. - Left atrium: The atrium was mildly dilated. - Tricuspid valve: Trivial regurgitation. - Impressions: No valvular vegetations identified. Impressions:  - No valvular vegetations identified.   Transthoracic echocardiogram Left ventricle: The cavity size was normal. There was mild to moderate concentric hypertrophy. Systolic function was normal. The estimated ejection fraction was in the range of 60% to 65%. Wall motion was normal; there were no regional wall motion abnormalities. - Aortic valve: Mildly calcified annulus. Trileaflet; normal thickness leaflets. - Mitral valve: Calcified annulus. - Left atrium: The atrium was moderately dilated. - Right atrium: The atrium was mildly  dilated. - Atrial septum: No defect or patent foramen ovale was identified.  DNR   Hospital Course: The patient is a 66 year old white male with metastatic cholangiocarcinoma, undergoing chemotherapy. He was directly admitted with positive blood cultures drawn as an outpatient. Several days prior to admission, he was noted to have a fever. Chest x-ray showed infiltrate versus atelectasis and patient was started on levofloxacin. Blood cultures grew out strep bovis and Enterobacter. Patient was admitted, started on IV gentamicin and ampicillin per recommendations of infectious disease. Patient had no evidence of endocarditis. ID felt that Port-A-Cath could remain in place. Repeat blood cultures were negative. The patient will go home with IV ampicillin and gentamicin to complete a two-week course from negative blood cultures. Total time on the day of discharge greater than 30 minutes.  Discharge Exam:  Blood pressure 128/70, pulse 72, temperature 98.2 F (36.8 C), temperature source Oral, resp. rate 18, height 6\' 2"  (1.88 m), weight 91.173 kg (201 lb), SpO2 92.00%.  Unchanged from 08/13/2012  Signed: Crista Curb L 08/14/2012, 12:44 PM

## 2012-08-14 NOTE — Progress Notes (Signed)
Patient received discharge instructions along with follow up appointments and prescriptions. Patient verbalized understanding of all instructions. Patient was escorted by staff via wheelchair to vehicle. Patient discharged to home in stable condition. 

## 2012-08-14 NOTE — Care Management Note (Addendum)
    Page 1 of 1   08/14/2012     12:44:01 PM   CARE MANAGEMENT NOTE 08/14/2012  Patient:  Lucas Patton, Lucas Patton   Account Number:  [id number]  Date Initiated:  08/14/2012  Documentation initiated by:  Rosemary Holms  Subjective/Objective Assessment:   Pt admitted from home with wife. To return home today with IV AB. [facility] to send RN for IV assistance. Alroy Bailiff has arranged for 6 pm dose to be given at home today.     Action/Plan:   Anticipated DC Date:  08/14/2012   Anticipated DC Plan:  HOME W HOME HEALTH SERVICES      DC Planning Services  CM consult      Choice offered to / List presented to:          [facility] arranged  HH-1 RN      [facility] agency  [facility]   Status of service:  Completed, signed off Medicare Important Message given?  YES (If response is "NO", the following Medicare IM given date fields will be blank) Date Medicare IM given:  08/14/2012 Date Additional Medicare IM given:    Discharge Disposition:  HOME W HOME HEALTH SERVICES  Per UR Regulation:    If discussed at Long Length of Stay Meetings, dates discussed:    Comments:  08/14/12 Rosemary Holms RN [facility] Judie Petit

## 2012-08-16 LAB — CULTURE, BLOOD (ROUTINE X 2): Culture: NO GROWTH

## 2012-08-19 ENCOUNTER — Inpatient Hospital Stay (HOSPITAL_COMMUNITY): Payer: Medicare HMO

## 2012-08-20 ENCOUNTER — Encounter (HOSPITAL_COMMUNITY): Payer: Self-pay | Admitting: Oncology

## 2012-08-20 ENCOUNTER — Other Ambulatory Visit (HOSPITAL_COMMUNITY): Payer: Self-pay | Admitting: Oncology

## 2012-08-22 ENCOUNTER — Telehealth (INDEPENDENT_AMBULATORY_CARE_PROVIDER_SITE_OTHER): Payer: Self-pay | Admitting: *Deleted

## 2012-08-22 DIAGNOSIS — C221 Intrahepatic bile duct carcinoma: Secondary | ICD-10-CM

## 2012-08-22 NOTE — Telephone Encounter (Signed)
Per Dr.Rehman the patient will need to have labs drawn. 

## 2012-08-23 LAB — HEPATIC FUNCTION PANEL
Albumin: 2.9 g/dL — ABNORMAL LOW (ref 3.5–5.2)
Bilirubin, Direct: 3.5 mg/dL — ABNORMAL HIGH (ref 0.0–0.3)
Total Bilirubin: 5.3 mg/dL — ABNORMAL HIGH (ref 0.3–1.2)

## 2012-08-26 ENCOUNTER — Inpatient Hospital Stay (HOSPITAL_COMMUNITY): Payer: Medicare HMO

## 2012-08-27 ENCOUNTER — Ambulatory Visit (HOSPITAL_COMMUNITY): Payer: Medicare HMO

## 2012-08-27 ENCOUNTER — Encounter (HOSPITAL_COMMUNITY): Payer: Medicare HMO | Attending: Oncology | Admitting: Oncology

## 2012-08-27 ENCOUNTER — Encounter (HOSPITAL_COMMUNITY): Payer: Self-pay | Admitting: Oncology

## 2012-08-27 VITALS — BP 100/66 | HR 87 | Temp 97.8°F | Resp 18 | Wt 213.0 lb

## 2012-08-27 DIAGNOSIS — A4181 Sepsis due to Enterococcus: Secondary | ICD-10-CM

## 2012-08-27 DIAGNOSIS — R7881 Bacteremia: Secondary | ICD-10-CM

## 2012-08-27 DIAGNOSIS — C221 Intrahepatic bile duct carcinoma: Secondary | ICD-10-CM

## 2012-08-27 DIAGNOSIS — C768 Malignant neoplasm of other specified ill-defined sites: Secondary | ICD-10-CM | POA: Insufficient documentation

## 2012-08-27 DIAGNOSIS — C786 Secondary malignant neoplasm of retroperitoneum and peritoneum: Secondary | ICD-10-CM

## 2012-08-27 DIAGNOSIS — A491 Streptococcal infection, unspecified site: Secondary | ICD-10-CM

## 2012-08-27 NOTE — Patient Instructions (Addendum)
[facility] Discharge Instructions  RECOMMENDATIONS MADE BY THE CONSULTANT AND ANY TEST RESULTS WILL BE SENT TO YOUR REFERRING PHYSICIAN.  EXAM FINDINGS BY THE PHYSICIAN TODAY AND SIGNS OR SYMPTOMS TO REPORT TO CLINIC OR PRIMARY PHYSICIAN: Exam and discussion by PA.  Home Health to deaccess your port a cath today.    MEDICATIONS PRESCRIBED:  none  INSTRUCTIONS GIVEN AND DISCUSSED: We will see you next Wednesday to discuss treatment plan.  Need to wait until after you are seen at [facility].  SPECIAL INSTRUCTIONS/FOLLOW-UP: Return for follow-up next Wednesday.  Thank you for choosing [facility] to provide your oncology and hematology care.  To afford each patient quality time with our providers, please arrive at least 15 minutes before your scheduled appointment time.  With your help, our goal is to use those 15 minutes to complete the necessary work-up to ensure our physicians have the information they need to help with your evaluation and healthcare recommendations.    Effective January 1st, 2014, we ask that you re-schedule your appointment with our physicians should you arrive 10 or more minutes late for your appointment.  We strive to give you quality time with our providers, and arriving late affects you and other patients whose appointments are after yours.    Again, thank you for choosing [facility].  Our hope is that these requests will decrease the amount of time that you wait before being seen by our physicians.       _____________________________________________________________  Should you have questions after your visit to [facility], please contact our office at [phone] between the hours of 8:30 a.m. and 5:00 p.m.  Voicemails left after 4:30 p.m. will not be returned until the following business day.  For prescription refill requests, have your pharmacy contact our office with your prescription refill request.

## 2012-08-27 NOTE — Progress Notes (Signed)
Kirk Ruths, MD [address] [place] [address]  Malignant neoplasm of intrahepatic bile ducts  Enterococcal septicemia  Streptococcus bovis infection  Bacteremia  CURRENT THERAPY: S/P cycle 1 of Cisplatin/Gemcitabine on 07/29/2012.  Presently on hold due to bacteremia  INTERVAL HISTORY: Lucas Patton 67 y.o. 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 Jola Babinski at [facility].   Lucas Patton is S/P admission and discharge from the [facility] from 08/11/2012 to 08/14/2012 for septicemia from enterococcal and streptococcus bovis infection secondary to contaminated biliary stent.  As a result, chemotherapy was placed on hold.   Lab work was performed by Dr. Karilyn Cota on 5/2 which revealed a total bilirubin of 5.3, direct bilirubin 3.5, indirect bilirubin 1.8, AST 142, ALT 105, alk phos 394.     His wife reports that Dr. Karilyn Cota has facilitated an appointment for [facility] to be seen at [facility] this Friday, 5/9 for evaluation of his biliary stents.   He completed his course of IV antibiotics yesterday at home with the help of Home Health.  We will defer de-accessing of port to Home Health today.  We will see him back in the clinic next Wednesday after he is seen at [facility].  His wife admits that he has some nausea, but no vomiting.  It is well controlled with current antiemetic regimen.  His appetite is decreased as well.   Past Medical History  Diagnosis Date  . Hypertension   . Thyroid disease   . Hypercholesteremia   . Hypothyroidism   . Myocardial infarction   . Cancer     Choleangiocarcinoma    has Biliary obstruction; CAD S/P percutaneous coronary angioplasty; HTN (hypertension); Hypothyroidism; HLD (hyperlipidemia); Cholelithiasis; Malignant neoplasm of intrahepatic bile ducts; Weight loss, unintentional; Psoriasis;  Enterococcal septicemia; Streptococcus bovis infection; Bacteremia; DVT (deep venous thrombosis); and Hyponatremia on his problem list.     is allergic to metoprolol.  Mr. Butterfield had no medications administered during this visit.  Past Surgical History  Procedure Laterality Date  . Cardiac stents    . Coronary angioplasty    . Colonoscopy    . Cataract extraction, bilateral    . Ercp  02/19/2012    Procedure: ENDOSCOPIC RETROGRADE CHOLANGIOPANCREATOGRAPHY (ERCP);  Surgeon: Malissa Hippo, MD;  Location: [facility] ORS;  Service: Endoscopy;  Laterality: N/A;  . Sphincterotomy  02/19/2012    Procedure: SPHINCTEROTOMY;  Surgeon: Malissa Hippo, MD;  Location: [facility] OR[facility];  Service: Endoscopy;  Laterality: N/A;  . Biliary stent placement  02/19/2012    Procedure: BILIARY STENT PLACEMENT;  Surgeon: Malissa Hippo, MD;  Location: [facility];  Service: Endoscopy;  Laterality: N/A;  . Ercp w/ metal stent placement  March 2014    done at [facility]  . Portacath placement Left 07/28/2012    Procedure: INSERTION PORT-A-CATH;  Surgeon: Dalia Heading, MD;  Location: [facility] ORS;  Service: General;  Laterality: Left;  Insertion Port-A-Cath Left Subclavian  . Tee without cardioversion N/A 08/13/2012    Procedure: TRANSESOPHAGEAL ECHOCARDIOGRAM (TEE);  Surgeon: Jonelle Sidle, MD;  Location: [facility] ENDO SUITE;  Service: Endoscopy;  Laterality: N/A;    Denies any headaches, dizziness, double vision, fevers, chills, night sweats, nausea, vomiting, diarrhea, constipation, chest pain, heart palpitations, shortness of breath, blood in stool, black tarry stool, urinary pain, urinary burning, urinary frequency, hematuria.   PHYSICAL EXAMINATION  ECOG PERFORMANCE STATUS:  2 - Symptomatic, <50% confined to bed  Filed Vitals:   08/27/12 0900  BP: 100/66  Pulse: 87  Temp: 97.8 F (36.6 C)  Resp: 18    GENERAL:alert, comfortable, cooperative and smiling SKIN: jaundiced HEAD: Normocephalic, No masses, lesions, tenderness or  abnormalities EYES: normal, Conjunctiva are pink and non-injected, mild scleral icterus EARS: External ears normal OROPHARYNX:mucous membranes are moist  NECK: supple, trachea midline LYMPH:  no palpable lymphadenopathy BREAST:not examined LUNGS: clear to auscultation and percussion HEART: regular rate & rhythm, no murmurs, no gallops, S1 normal and S2 normal ABDOMEN:abdomen soft, normal bowel sounds and RUQ and RLQ tenderness to palpation BACK: Back symmetric, no curvature., No CVA tenderness EXTREMITIES:less then 2 second capillary refill, no skin discoloration, positive findings:  edema B/L 2+ pitting edema pretibially  NEURO: alert & oriented x 3 with fluent speech, no focal motor/sensory deficits, gait normal    LABORATORY DATA: CBC    Component Value Date/Time   WBC 3.8* 08/13/2012 0517   RBC 3.29* 08/13/2012 0517   HGB 10.4* 08/13/2012 0517   HCT 29.0* 08/13/2012 0517   PLT 142* 08/13/2012 0517   MCV 88.1 08/13/2012 0517   MCH 31.6 08/13/2012 0517   MCHC 35.9 08/13/2012 0517   RDW 14.8 08/13/2012 0517   LYMPHSABS 0.8 08/11/2012 1605   MONOABS 1.0 08/11/2012 1605   EOSABS 0.0 08/11/2012 1605   BASOSABS 0.0 08/11/2012 1605      Chemistry      Component Value Date/Time   NA 132* 08/13/2012 0517   K 3.3* 08/13/2012 0517   CL 98 08/13/2012 0517   CO2 28 08/13/2012 0517   BUN 5* 08/13/2012 0517   CREATININE 0.70 08/13/2012 0517      Component Value Date/Time   CALCIUM 8.2* 08/13/2012 0517   ALKPHOS 394* 08/22/2012 0851   AST 142* 08/22/2012 0851   ALT 105* 08/22/2012 0851   BILITOT 5.3* 08/22/2012 0851      Results for Lucas Patton, Lucas Patton (MRN [id number]) as of 08/27/2012 08:48  Ref. Range 08/22/2012 08:51  Alkaline Phosphatase Latest Range: 39-117 U/L 394 (H)  Albumin Latest Range: 3.5-5.2 g/dL 2.9 (L)  AST Latest Range: 0-37 U/L 142 (H)  ALT Latest Range: 0-53 U/L 105 (H)  Total Protein Latest Range: 6.0-8.3 g/dL 6.3  Bilirubin, Direct Latest Range: 0.0-0.3 mg/dL 3.5 (H)  Indirect  Bilirubin Latest Range: 0.0-0.9 mg/dL 1.8 (H)  Total Bilirubin Latest Range: 0.3-1.2 mg/dL 5.3 (H)     RADIOGRAPHIC STUDIES:  08/11/2012  *RADIOLOGY REPORT*  Clinical Data: Sepsis. Cholangiocarcinoma with stent. Question  infection/abscess. Fever and abdominal pain.  CT ABDOMEN AND PELVIS WITH CONTRAST  Technique: Multidetector CT imaging of the abdomen and pelvis was  performed following the standard protocol during bolus  administration of intravenous contrast.  Contrast: 1 OMNIPAQUE IOHEXOL 300 MG/ML SOLN, 1 OMNIPAQUE IOHEXOL  300 MG/ML SOLN 100 ml Omnipaque-300  Comparison: 07/24/2012  Findings: Lung bases: Bibasilar opacities which are favored to  represent areas of scarring and atelectasis. Mild cardiomegaly  with coronary artery atherosclerosis. Small right pleural effusion  is unchanged.  Abdomen/pelvis: Mild cirrhosis suspected. Mild intrahepatic  biliary ductal dilatation which is unchanged. Bilateral  intrahepatic biliary stents, both terminating at the distal common  duct. There is surrounding amorphous soft tissue density in the  region of the hepatic hilum and adjacent porta hepatis. This  measures 4.3 x 4.8 cm on image 32/series 2 and may be slightly  increased since prior. There is air in this region, increased,  including on image 31/series 2.  The branch portal veins are patent. There is mass effect upon the  portal vein in the region of the hepatic hilum on image 28/series  2.  No focal liver lesion.  Mild splenomegaly. Normal stomach. A descending duodenal  diverticulum. Normal pancreas, adrenal glands. Upper pole left  renal too small to characterize lesions.  Nonaneurysmal infrarenal aortic dilatation. No retroperitoneal or  retrocrural adenopathy.  Sigmoid diverticulosis with wall thickening, likely due to muscular  hypertrophy. Scattered diverticula throughout remainder of the  colon. Prominent fatty ileocecal valve. Appendix is not visualized  but  there is no evidence of right lower quadrant inflammation.  Normal small bowel loops. Trace perihepatic ascites which is  similar. No well-defined peritoneal/omental metastasis.  No pelvic adenopathy. Normal urinary bladder and prostate.  Slight increase in the small volume cul-de-sac fluid, simple in  appearance.  Bones/Musculoskeletal: Left sacroiliac degenerative partial  fusion.  IMPRESSION:  1. Bilateral biliary stents in place. Persistent mild  intrahepatic ductal dilatation with similar to slight increase in  amorphous soft tissue density in the region of the hepatic hilum.  This is likely the site of the patient's reported  cholangiocarcinoma. There is gas in this area, adjacent to the  biliary stents. Although this could represent pneumobilia, tumor  necrosis could have a similar appearance. No evidence of drainable  fluid collection or significant enhancement to suggest abscess.  2. No other explanation for sepsis.  3. Slight increase and the small volume pelvic ascites.  4. Persistent small right pleural effusion with bibasilar  opacities which are favored to represent areas of atelectasis or  scar.  5. Mass effect upon the central portal vein, without evidence of  acute thrombus.  Original Report Authenticated By: Jeronimo Greaves, M.D.     ASSESSMENT:  1. 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 Jola Babinski at [facility].  2. Complication of bacteremia with colonization of enterococcus and streptococcus bovis secondary to contamination of biliary stent without cardiac vegetation causing a delay in chemotherapy.  Patient Active Problem List   Diagnosis Date Noted  . Weight loss, unintentional 08/11/2012  . Psoriasis 08/11/2012  . Enterococcal septicemia 08/11/2012  . Streptococcus bovis infection 08/11/2012  . Bacteremia 08/11/2012  . DVT (deep venous thrombosis) 08/11/2012  . Hyponatremia  08/11/2012  . Malignant neoplasm of intrahepatic bile ducts 07/22/2012  . Cholelithiasis 02/20/2012  . Biliary obstruction 02/18/2012  . CAD S/P percutaneous coronary angioplasty 02/18/2012  . HTN (hypertension) 02/18/2012  . Hypothyroidism 02/18/2012  . HLD (hyperlipidemia) 02/18/2012     PLAN:  1. I personally reviewed and went over laboratory results with the patient. 2. I personally reviewed and went over radiographic studies with the patient. 3. Day 8 and Day 9 of cycle 1 cancelled from treatment plan. 4. Patient will have port de-accessed by home health today. 5. He has an appointment with [facility] this Friday on 08/29/2012. 6. Encouraged elevation of LE when resting.  Will treat LE edema conservatively since he is asymptomatic. 7. Return next Wednesday for further discussion of therapy choices depending on success at [facility].   All questions were answered. The patient knows to call the clinic with any problems, questions or concerns. We can certainly see the patient much sooner if necessary.  The patient and plan discussed with Glenford Peers, MD and he is in agreement with the aforementioned.  KEFALAS,THOMAS

## 2012-09-01 ENCOUNTER — Telehealth (INDEPENDENT_AMBULATORY_CARE_PROVIDER_SITE_OTHER): Payer: Self-pay | Admitting: *Deleted

## 2012-09-01 DIAGNOSIS — IMO0001 Reserved for inherently not codable concepts without codable children: Secondary | ICD-10-CM

## 2012-09-01 NOTE — Telephone Encounter (Signed)
Per Dr.Rehman the patient will need to have labs drawn. 

## 2012-09-01 NOTE — Telephone Encounter (Signed)
Lucas Patton would like to see if Dr. Karilyn Cota has spoken to Dr. Maretta Los from [facility]? He was unable to clean out Lucas Patton's stents due to low sodium levels. He was going to have Dr. Karilyn Cota do some additional blood work and check his sodium levels again. They are needing his labs done before going back to [facility]. Lucas Patton's return phone number is [phone].

## 2012-09-02 ENCOUNTER — Telehealth (INDEPENDENT_AMBULATORY_CARE_PROVIDER_SITE_OTHER): Payer: Self-pay | Admitting: *Deleted

## 2012-09-02 DIAGNOSIS — E871 Hypo-osmolality and hyponatremia: Secondary | ICD-10-CM

## 2012-09-02 LAB — BASIC METABOLIC PANEL
CO2: 25 mEq/L (ref 19–32)
Calcium: 8.1 mg/dL — ABNORMAL LOW (ref 8.4–10.5)
Glucose, Bld: 104 mg/dL — ABNORMAL HIGH (ref 70–99)
Potassium: 5.1 mEq/L (ref 3.5–5.3)
Sodium: 125 mEq/L — ABNORMAL LOW (ref 135–145)

## 2012-09-02 NOTE — Telephone Encounter (Signed)
Lucas Patton would like to get the results of his sodium levels. She needs to know today to know weather or not they need to go back to [facility] tomorrow or not.  The return phone number is [phone].

## 2012-09-02 NOTE — Telephone Encounter (Signed)
This has been addressed with her , and she is aware that Dr.Rehman is calling Dr.Burbridge and that either Dr.Rehman , myself or one of Dr. Shana Chute would call them back.

## 2012-09-02 NOTE — Telephone Encounter (Signed)
Per Dr.Rehman,  Call [facility] to see if they have Declomycin 300 mg , patient to take 1 by mouth twice a day #30 1 refill This prescription was called to [facility] @ [facility] Patient's wife [name] made aware. Lab order faxed for B-Met to be done on Thursday May 15 th

## 2012-09-03 ENCOUNTER — Telehealth (HOSPITAL_COMMUNITY): Payer: Self-pay

## 2012-09-03 ENCOUNTER — Ambulatory Visit (HOSPITAL_COMMUNITY): Payer: Medicare HMO | Admitting: Oncology

## 2012-09-03 NOTE — Telephone Encounter (Signed)
Call from patient's wife - states " Lucas Patton has not been back to [facility] yet for his ERCP yet because his sodium is too low.  Dr. Karilyn Cota is aware and he put him on Demeclocycline twice daily.  Dr. Karilyn Cota is rechecking his sodium level tomorrow and if it's high enough we will be going to [facility] on Friday."  Appointment for today cancelled and rescheduled for next Thursday.

## 2012-09-04 LAB — BASIC METABOLIC PANEL
CO2: 26 mEq/L (ref 19–32)
Calcium: 7.8 mg/dL — ABNORMAL LOW (ref 8.4–10.5)
Chloride: 94 mEq/L — ABNORMAL LOW (ref 96–112)
Creat: 1.48 mg/dL — ABNORMAL HIGH (ref 0.50–1.35)
Glucose, Bld: 153 mg/dL — ABNORMAL HIGH (ref 70–99)

## 2012-09-05 ENCOUNTER — Telehealth (INDEPENDENT_AMBULATORY_CARE_PROVIDER_SITE_OTHER): Payer: Self-pay | Admitting: *Deleted

## 2012-09-05 DIAGNOSIS — E871 Hypo-osmolality and hyponatremia: Secondary | ICD-10-CM

## 2012-09-05 NOTE — Telephone Encounter (Signed)
Per Dr.Rehman the patient will need to have labs drawn. 

## 2012-09-09 LAB — BASIC METABOLIC PANEL
BUN: 10 mg/dL (ref 6–23)
CO2: 28 mEq/L (ref 19–32)
Calcium: 8.5 mg/dL (ref 8.4–10.5)
Chloride: 94 mEq/L — ABNORMAL LOW (ref 96–112)
Creat: 1.31 mg/dL (ref 0.50–1.35)

## 2012-09-11 ENCOUNTER — Ambulatory Visit (HOSPITAL_COMMUNITY): Payer: Medicare HMO | Admitting: Oncology

## 2012-09-22 ENCOUNTER — Encounter (INDEPENDENT_AMBULATORY_CARE_PROVIDER_SITE_OTHER): Payer: Self-pay

## 2012-09-22 ENCOUNTER — Telehealth (HOSPITAL_COMMUNITY): Payer: Self-pay | Admitting: *Deleted

## 2012-09-22 NOTE — Telephone Encounter (Signed)
Per wife: Lucas Patton was able to flush the rt stent but not the left because it was completely stopped up. They put a biliary drain on the rt side to help drain. They couldn't get the drain in on the lt side. Wife is flushing the tube every 8 hours with 10ml of normal saline. Drainage in bag is yellowish green. Wife is reporting approximately half a cup of drainage from the drainage bag three times a day. There is thin white drainage around the insertion site of the  biliary tube. She is changing the drsg two or three times a day. Wife also said that [facility] wants to keep the drain tube in for 3 months and that if the tube needs to be taken out and reinserted then Dr. Mariel Patton could call to place the order at any time. She will bring the paperwork in to appt tomorrow. Wife states that [facility] drew 1.5L off of stomach and 1.5L off the rt lung (tapped the rt lung twice).   Drinking 2 boost per day. Appetite 25%. Eating ice chips. Able to walk around the house by self if feeling steady. Using cane or walker if needed. Weak. Losing weight. No dyspnea. Sleeping a lot. Eyes white. Skin color looks somewhat yellow. Some dizziness upon standing too quickly. Urine normal colored. BMs normal.   Meds changed: Increased Eliquis to 5mg  bid (to keep blood clots out of the liver vein), development of afib so started on Diltiazem 360mg  daily, Augmentin 875-125mg  bid, Culturelle 1 daily, Spironolactone 25mg  daily to keep fluid off of abdomen, Tessalon pearl 100mg  Tid prn cough, decreased Senokot to 1 tab daily, stopped the Demeclocycline, stopped the fish oil, stopped the klor-con.  Prognosis given to her by [facility]: he has weeks to months. They just kept telling patient that he needed to see Dr. Mariel Patton ASAP.

## 2012-09-23 ENCOUNTER — Encounter (HOSPITAL_COMMUNITY): Payer: Medicare HMO | Attending: Oncology | Admitting: Oncology

## 2012-09-23 ENCOUNTER — Encounter (HOSPITAL_COMMUNITY): Payer: Self-pay | Admitting: Oncology

## 2012-09-23 VITALS — BP 105/71 | HR 88 | Temp 97.4°F | Resp 18 | Wt 197.1 lb

## 2012-09-23 DIAGNOSIS — R918 Other nonspecific abnormal finding of lung field: Secondary | ICD-10-CM | POA: Insufficient documentation

## 2012-09-23 DIAGNOSIS — R188 Other ascites: Secondary | ICD-10-CM

## 2012-09-23 DIAGNOSIS — I82409 Acute embolism and thrombosis of unspecified deep veins of unspecified lower extremity: Secondary | ICD-10-CM

## 2012-09-23 DIAGNOSIS — R05 Cough: Secondary | ICD-10-CM | POA: Insufficient documentation

## 2012-09-23 DIAGNOSIS — R110 Nausea: Secondary | ICD-10-CM

## 2012-09-23 DIAGNOSIS — C786 Secondary malignant neoplasm of retroperitoneum and peritoneum: Secondary | ICD-10-CM

## 2012-09-23 DIAGNOSIS — C221 Intrahepatic bile duct carcinoma: Secondary | ICD-10-CM | POA: Insufficient documentation

## 2012-09-23 DIAGNOSIS — R059 Cough, unspecified: Secondary | ICD-10-CM | POA: Insufficient documentation

## 2012-09-23 DIAGNOSIS — R634 Abnormal weight loss: Secondary | ICD-10-CM | POA: Insufficient documentation

## 2012-09-23 MED ORDER — APIXABAN 5 MG PO TABS: 5.0000 mg | ORAL_TABLET | Freq: Two times a day (BID) | ORAL | Status: DC

## 2012-09-23 MED ORDER — OXYCODONE HCL 5 MG PO TABS: ORAL_TABLET | ORAL | Status: AC

## 2012-09-23 MED ORDER — DRONABINOL 5 MG PO CAPS: 10.0000 mg | ORAL_CAPSULE | Freq: Two times a day (BID) | ORAL | Status: AC

## 2012-09-23 NOTE — Patient Instructions (Addendum)
[facility] Discharge Instructions  RECOMMENDATIONS MADE BY THE CONSULTANT AND ANY TEST RESULTS WILL BE SENT TO YOUR REFERRING PHYSICIAN.  EXAM FINDINGS BY THE PHYSICIAN TODAY AND SIGNS OR SYMPTOMS TO REPORT TO CLINIC OR PRIMARY PHYSICIAN: Exam and discussion by MD.  Do not think chemotherapy is a good option at this time.  Chemotherapy would probably decrease your quality of life.  We will do what we need to do to keep you comfortable.  MEDICATIONS PRESCRIBED:  Oxycodone 5mg  take 1 or 2 every 4 hours as needed for pain. Marinol 5mg  take 2 capsules twice daily to help with your appetite and nausea.  INSTRUCTIONS GIVEN AND DISCUSSED: Report uncontrolled pain or other problems.  SPECIAL INSTRUCTIONS/FOLLOW-UP: Follow-up in 4 weeks.  Thank you for choosing [facility] to provide your oncology and hematology care.  To afford each patient quality time with our providers, please arrive at least 15 minutes before your scheduled appointment time.  With your help, our goal is to use those 15 minutes to complete the necessary work-up to ensure our physicians have the information they need to help with your evaluation and healthcare recommendations.    Effective January 1st, 2014, we ask that you re-schedule your appointment with our physicians should you arrive 10 or more minutes late for your appointment.  We strive to give you quality time with our providers, and arriving late affects you and other patients whose appointments are after yours.    Again, thank you for choosing [facility].  Our hope is that these requests will decrease the amount of time that you wait before being seen by our physicians.       _____________________________________________________________  Should you have questions after your visit to [facility], please contact our office at [phone] between the hours of 8:30 a.m. and 5:00 p.m.  Voicemails left after  4:30 p.m. will not be returned until the following business day.  For prescription refill requests, have your pharmacy contact our office with your prescription refill request.

## 2012-09-23 NOTE — Progress Notes (Signed)
Dressing to right drain site changed.  Skin cleaned and DSD applied.

## 2012-09-23 NOTE — Progress Notes (Signed)
This gentleman has stage IV cholangiocarcinoma with peritoneal metastases. He was not an operative candidate. He is status post 1 cycle of chemotherapy with gemcitabine and cis-platinum on 07/29/2012 and has not been able to be treated since due to multiple complications. He has been infected, he has had blockage of his stent especially to the left lobe, he has developed ascites, progressive weakness, nausea, anorexia, weight loss that has gotten worse. He now has an intrahepatic biliary drain external bag draining multiple cc per day(more than 500 cc).  He is in bed or sitting in a chair more than 95% of the day. His performance status continues to decline. He has not had fevers or chills since being home from [facility]. He is on antibiotic namely Augmentin which he will finish this week. Presently he is afebrile. He is extremely weak-looking. He is nauseated. He has 2+ to 3+ pitting lower extremity edema. He still has a sense of ascites. He has a biliary drain coming out the right side of his flank. He is not jaundiced. His heart shows a tachycardia with a rate of approximately 150-160. He is now on diltiazem CD 320 mg. We will continue that.  His abdomen is soft but with some ascites. His liver at age is approximately 1 cm below the costal margin.  He and his wife and I with my nurse in the room had a very long discussion about probable ongoing futility of chemotherapy. His performance status would not allow it and he is at risk for further infections post therapy. We talked about palliative measures such as Marinol, ongoing use of apixaban because of his DVT, ongoing therapy with his antiarrhythmics, Pain medication use with OxyIR, and eventually we discussed hospice intervention which he will consider.  I think his prognosis is clearly under 6 months. I think we could potentially shorten his life with any further use of chemotherapy. Therefore I have not recommended.  We will tentatively see him in 3-4  weeks sooner if need be. We spent 45-55 minutes in counseling/consultation.

## 2012-09-24 NOTE — Progress Notes (Signed)
Patient had ERCP but one of the 2 stents stents could not be cannulated/opened

## 2012-09-26 ENCOUNTER — Ambulatory Visit (HOSPITAL_COMMUNITY): Payer: Medicare HMO

## 2012-09-26 ENCOUNTER — Telehealth (HOSPITAL_COMMUNITY): Payer: Self-pay

## 2012-09-26 ENCOUNTER — Telehealth (HOSPITAL_COMMUNITY): Payer: Self-pay | Admitting: Oncology

## 2012-09-26 NOTE — Telephone Encounter (Signed)
Received call from [facility] RN, Karlyn Agee, regarding Lucas Patton's VS this morning:  HR 138-150, BP 98/56, oral temp 97.5.  Patient also continues to report fatigue and dizziness.  Lynden Ang reports having contacted Dr. Avie Echevaria at [facility] who deferred patients care to Dr. Mariel Sleet here in [place].  I spoke to Dr. Sharia Reeve who advised that patient can be seen this afternoon - appt. made for 2pm today and informed patient's wife, [name], of importance of being seen by a physician.

## 2012-09-26 NOTE — Telephone Encounter (Signed)
Call from [place] - Stated that they were not sure that Ry needed to come in today.  "The Home Health RN that saw him was new and she did not know Kortney.  He had just got up when she came and he is sometimes dizzy when he first gets up.  He had not taken any of his medication nor had he eaten anything.  She said his heart rate was up and his BP was low.  Bern's not sure he wants to come this afternoon."  Explained that it was their choice whether or not to come in.  At this time they plan to come in, if Riot changes his mind, [name] will call and let us know.

## 2012-09-29 ENCOUNTER — Telehealth (HOSPITAL_COMMUNITY): Payer: Self-pay

## 2012-09-29 NOTE — Telephone Encounter (Signed)
My instructions were that patient come in for evaluation on 09/26/12, given his history and  reported tachycardia, I was concerned about sepsis. Patient reportedly chose not to come understanding that he could get into more serious complications and potentially death.I will be open to seing him if he changes his mind.

## 2012-09-29 NOTE — Telephone Encounter (Signed)
Message left on voicemail from Lucas Patton.  Stated that Lucas Patton did not think he needed to come in to be seen today and wanted to cancel his appointment.

## 2012-09-30 ENCOUNTER — Telehealth (HOSPITAL_COMMUNITY): Payer: Self-pay

## 2012-09-30 ENCOUNTER — Other Ambulatory Visit (HOSPITAL_COMMUNITY): Payer: Self-pay | Admitting: Oncology

## 2012-09-30 ENCOUNTER — Telehealth (HOSPITAL_COMMUNITY): Payer: Self-pay | Admitting: *Deleted

## 2012-09-30 DIAGNOSIS — R05 Cough: Secondary | ICD-10-CM

## 2012-09-30 DIAGNOSIS — R059 Cough, unspecified: Secondary | ICD-10-CM

## 2012-09-30 NOTE — Telephone Encounter (Signed)
Need to start with a chest xray.  Let me know if that is something they want to do.  If they cannot come in for a chest xray, let me know, I can try something empirically.

## 2012-09-30 NOTE — Telephone Encounter (Signed)
Ellouise Newer, PA-C at 09/30/2012 5:14 PM   Status: Signed            Need to start with a chest xray. Let me know if that is something they want to do. If they cannot come in for a chest xray, let me know, I can try something empirically.        Adelene Amas, RN at 09/30/2012 3:23 PM    Status: Signed             [facility]as [facility]ton with [facility] called to report that Gee is very lethargic, diminished breath sounds, Congested cough but not really bringing anything up. Eating and drinking very little. Finished amoxicillin last week. Nurse and wife have discussed hospice, wife still not ready to talk to [place] about it. Wife concerned that he is developing fluid or pneumonia again. Wife wanted to see if Dr. Mariel Sleet suggested another antibiotic.  Lucas Patton [phone] spoke with [name] - stated "Dimarco is really weak and not eating or drinking much.  Has non-productive cough and the Tessalon pearles are not taking care of the cough.  Has only been taking 100 mg 3 times daily as needed."  Discussed with Dellis Anes, PA-C  and will increase the tessalon to 200 mg 3 times daily and if patient will, to come in tomorrow to get a chest xray.  [name] will talk with Kervin and let us know tomorrow if he's not able/willing to come in.  Discussed potential hospice referral and [name] will talk with Joram and let us know.

## 2012-09-30 NOTE — Telephone Encounter (Signed)
[facility]as Patton with [facility] called to report that Lucas Patton is very lethargic, diminished breath sounds,  Congested cough but not really bringing anything up. Eating and drinking very little. Finished amoxicillin last week. Nurse and wife have discussed hospice, wife still not ready to talk to [place] about it. Wife concerned that he is developing fluid or pneumonia again. Wife wanted to see if Dr. Mariel Sleet suggested another antibiotic. Lucas Patton [phone]

## 2012-10-01 ENCOUNTER — Ambulatory Visit (HOSPITAL_COMMUNITY)
Admission: RE | Admit: 2012-10-01 | Discharge: 2012-10-01 | Disposition: A | Discharge status: Home / Self Care | Payer: Medicare HMO | Source: Ambulatory Visit | Attending: Oncology | Admitting: Oncology

## 2012-10-01 ENCOUNTER — Encounter (HOSPITAL_BASED_OUTPATIENT_CLINIC_OR_DEPARTMENT_OTHER): Payer: Medicare HMO | Admitting: Oncology

## 2012-10-01 VITALS — BP 95/64 | HR 143 | Temp 97.7°F | Resp 18

## 2012-10-01 DIAGNOSIS — J90 Pleural effusion, not elsewhere classified: Secondary | ICD-10-CM | POA: Insufficient documentation

## 2012-10-01 DIAGNOSIS — R05 Cough: Secondary | ICD-10-CM

## 2012-10-01 DIAGNOSIS — R9389 Abnormal findings on diagnostic imaging of other specified body structures: Secondary | ICD-10-CM

## 2012-10-01 DIAGNOSIS — R059 Cough, unspecified: Secondary | ICD-10-CM

## 2012-10-01 DIAGNOSIS — R000 Tachycardia, unspecified: Secondary | ICD-10-CM

## 2012-10-01 DIAGNOSIS — C221 Intrahepatic bile duct carcinoma: Secondary | ICD-10-CM

## 2012-10-01 DIAGNOSIS — C786 Secondary malignant neoplasm of retroperitoneum and peritoneum: Secondary | ICD-10-CM

## 2012-10-01 MED ORDER — LEVOFLOXACIN 750 MG PO TABS: 750.0000 mg | ORAL_TABLET | Freq: Every day | ORAL | Status: AC

## 2012-10-01 NOTE — Progress Notes (Signed)
Chest xray done.  Complains with discomfort in right mid-back.  Here to discuss hospice care and results of chest xray.

## 2012-10-01 NOTE — Progress Notes (Signed)
Yaniv's wife called yesterday reporting that the patient has a cough after completing antibiotics and requests another antibiotic, a "more powerful one."  I ordered a chest xray yesterday to be performed today and those results are pending.  Jamori's wife says that she is also ready to broach the subject of Hospice with her husband and asks if he can be seen today in the clinic for that discussion.  As a result, she is here with him today as a work-in.   Majority of discussion as communicated by myself and the patient's wife. Sal is noted to be lethargic and tired.  We spent the majority of our visit today discussing hospice. His pretty clear that the patient is declining. He has lost quite a little weight since I seen him last. He is more jaundiced in the skin, but his sclera is relatively clear. He is noted be tachycardic today with a heart rate in the 130s. Despite that, I think it is a mute point to investigate this further with this decline and failure to thrive.  We had a long conversation regarding Hospice.  The patient is aware that he is not a chemotherapy candidate any longer.  Patient education was given regarding Hospice and the services they provide.  The patient understands that studies report that early enrollment in Hospice actually allows the patient to live longer compared to those who enroll in Hospice nearer to end of life.  The patient knows that Hospice will not cover chemotherapy.  Hospice will allow the patient to stay at home at end of life or go to a facility for end of life care.  At this point, the patient would like to remain at home.  Hospice provides the patient with a team of providers to help with care including physicians, nurses, aids, chaplains, and social workers.  Hospice's goal is to keep patients out of the hospital and and comfortable by controlling symptoms with medications.  The patient is certainly Hospice appropriate with a life expectancy of less than 6 months  with a better estimation of length of life in 1-4 weeks in my opinion.    He is agreeable to a hospice referral and that will be called in today.  I personally reviewed and went over radiographic studies with the patient.  His chest x-ray today reveals no pulmonary edema, small right effusion with right basilar consolidation, atelectasis, or infiltrate. With this information and his cough, I prescribed Levaquin 750 mg daily. This was discussed with pharmacy and is felt that 750 mg daily as an adequate dose for this patient. The patient's wife reports that Adonijah is in the "donut hole" and she is concerned about the Price of this medication. I do not know how much this medication will cause her so I will E. scribed it and if the medication is too much, she is advised not to pick up the medication, and contact the clinic so we can prescribe a different medication or wait until hospice is involved and see if they will cover the medication. She reports that his cough is worse when he lays down.  He does have Tessalon Perles and yesterday, and increases this to 200 mg 3 times daily. He also has pain medication at home and I've encouraged him to take a tablet 2-3 times a day for antitussive effects. Again, once hospice is involved, we can entertain other antitussives.  Deshane is not eating and I've encouraged him and his wife not to force food at this  will make nausea and/or vomiting worse. He is on Marinol and it appears as though this is not helping much with his appetite, but I suspect it is helping significantly with nausea vomiting and his sense of well-being.  Kepler is noted to be tired and she reports that he is sleeping during the day. He is having significant drainage of his hepatobiliary drainage tube particularly at night.  Physical exam: Gen.: Patient seen in wheelchair. Skin is jaundiced, but sclera are clear.  He is lethargic and tired in the office today. HEENT: Atraumatic, normocephalic,  anicteric sclera. Cardiac: Regular rate and rhythm, tachycardic with a heart rate in the 130s. Lungs: Clear to auscultation with lack of breath sounds in the right lower base. Abdomen: Hepatobiliary drainage appreciated with drainage noted in bag. Extremities: Bilateral lower extremity edema Skin: Mildly jaundiced and sallow Neuro: Alert yet lethargic. Does not participate much in discussion today. Tired. In wheelchair. BP 95/64  Pulse 143  Temp(Src) 97.7 F (36.5 C) (Oral)  Resp 18  RADIOGRAPHY:  *RADIOLOGY REPORT*  Clinical Data: Pleural effusions  CHEST - 1 VIEW  Comparison: 08/11/2012  Findings: Cardiomediastinal silhouette is stable. Left subclavian  Port-A-Cath with tip in SVC. There is small right pleural effusion  with right basilar consolidation atelectasis or infiltrate. Again  noted small calcified granulomas bilaterally. Left lung is clear.  IMPRESSION:  No pulmonary edema. Small right pleural effusion with right  basilar consolidation, atelectasis or infiltrate. Stable bilateral  small calcified granulomas.  Original Report Authenticated By: Natasha Mead, M.D.   Assessment: 1. Failure to thrive 2. Weight loss 3. Decreased appetite is poor. 4. Stage IV cholangiocarcinoma with peritoneal metastases. He was not an operative candidate. He is status post 1 cycle of chemotherapy with gemcitabine and cisplatin on 07/29/2012 and has not been able to be treated since due to multiple complications   Plan: 1. Referral to hospice 2.I personally reviewed and went over radiographic studies with the patient. 3. E. scribed Levaquin 750 mg daily x10 days to [employer]. If medication is too expensive, wife was advised not to pick up medication and contact the clinic. 4. Recommend patient does not force feed. 5. Recommended crushed ice to help with sensation of dry mouth and also help with hydration. 6. Despite tachycardic, I do not think that IV fluids are indicated due to  the patient's decline. Concern for complications in the clinic appreciated. 7. We will be available for hospice orders and for questions if needed.  8. Will see the patient on a when necessary basis. Next  All questions were answered. The patient is call the clinic with any problems, questions, or concerns.  Patient and plan discussed with Dr. Erline Hau and he is in agreement with the aforementioned.   More than 50% of the time spent with the patient was utilized for counseling and coordination of care.

## 2012-10-01 NOTE — Patient Instructions (Addendum)
[facility] Discharge Instructions  RECOMMENDATIONS MADE BY THE CONSULTANT AND ANY TEST RESULTS WILL BE SENT TO YOUR REFERRING PHYSICIAN.  EXAM FINDINGS BY THE PHYSICIAN TODAY AND SIGNS OR SYMPTOMS TO REPORT TO CLINIC OR PRIMARY PHYSICIAN: Exam and discussion by PA.  We will contact hospice to come and see you.  Xray shows early consolidation and we will prescribe an antibiotic for you.  MEDICATIONS PRESCRIBED:  Levaquin as directed.  INSTRUCTIONS GIVEN AND DISCUSSED:  Don't force food.  Eat and drink what you want.  Report uncontrolled pain or other problems.   SPECIAL INSTRUCTIONS/FOLLOW-UP: 1 month or as needed.  Thank you for choosing [facility] to provide your oncology and hematology care.  To afford each patient quality time with our providers, please arrive at least 15 minutes before your scheduled appointment time.  With your help, our goal is to use those 15 minutes to complete the necessary work-up to ensure our physicians have the information they need to help with your evaluation and healthcare recommendations.    Effective January 1st, 2014, we ask that you re-schedule your appointment with our physicians should you arrive 10 or more minutes late for your appointment.  We strive to give you quality time with our providers, and arriving late affects you and other patients whose appointments are after yours.    Again, thank you for choosing [facility].  Our hope is that these requests will decrease the amount of time that you wait before being seen by our physicians.       _____________________________________________________________  Should you have questions after your visit to [facility], please contact our office at [phone] between the hours of 8:30 a.m. and 5:00 p.m.  Voicemails left after 4:30 p.m. will not be returned until the following business day.  For prescription refill requests, have your pharmacy  contact our office with your prescription refill request.

## 2012-10-02 ENCOUNTER — Ambulatory Visit (HOSPITAL_COMMUNITY): Payer: Medicare HMO | Admitting: Oncology

## 2012-10-03 ENCOUNTER — Telehealth (HOSPITAL_COMMUNITY): Payer: Self-pay | Admitting: Oncology

## 2012-10-03 DIAGNOSIS — C221 Intrahepatic bile duct carcinoma: Secondary | ICD-10-CM

## 2012-10-03 DIAGNOSIS — K831 Obstruction of bile duct: Secondary | ICD-10-CM

## 2012-10-03 MED ORDER — FENTANYL 50 MCG/HR TD PT72: 1.0000 | MEDICATED_PATCH | TRANSDERMAL | Status: AC

## 2012-10-03 NOTE — Telephone Encounter (Signed)
Patient is declining rapidly.   Order for Fentanyl 50 mcg/hr transdermal provided.  Rx printed and faxed to [facility]  Hospice nurse requested order for biliary drain removal.  Obliged.   Hospital bed ordered.  Patient refuses Hospice home at present time.  No beds available at hospice home at this time, but when one becomes available, patient will be encouraged to reconsider.  Recommend D/C of BP medications and Eliquis  Flem Enderle

## 2012-10-21 ENCOUNTER — Ambulatory Visit (HOSPITAL_COMMUNITY): Payer: Medicare HMO

## 2012-10-21 DEATH — deceased

## 2012-12-09 ENCOUNTER — Encounter (INDEPENDENT_AMBULATORY_CARE_PROVIDER_SITE_OTHER): Payer: Self-pay

## 2013-02-05 ENCOUNTER — Other Ambulatory Visit (HOSPITAL_COMMUNITY): Payer: Self-pay | Admitting: Oncology

## 2013-09-11 IMAGING — RF DG ERCP WO/W SPHINCTEROTOMY
1 series · 15 of 19 positions shown · non-contrast
Comparison: Ultrasound of 02/18/2012

CLINICAL DATA: Biliary ductal dilatation on prior ultrasound.

ERCP
TECHNIQUE: Multiple spot images obtained with the fluoroscopic
device and submitted for interpretation post-procedure.  ERCP was
performed by Dr. .

[Series 1: run · 8 acquisitions, 15 frames shown]
[im 1/8]
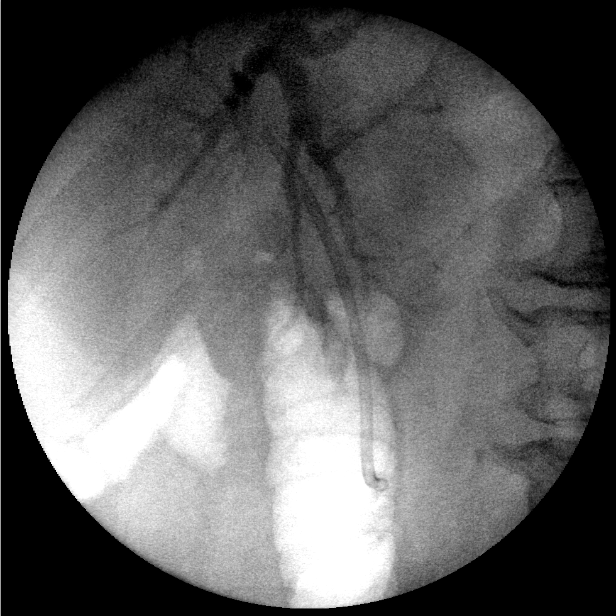
[im 1/8]
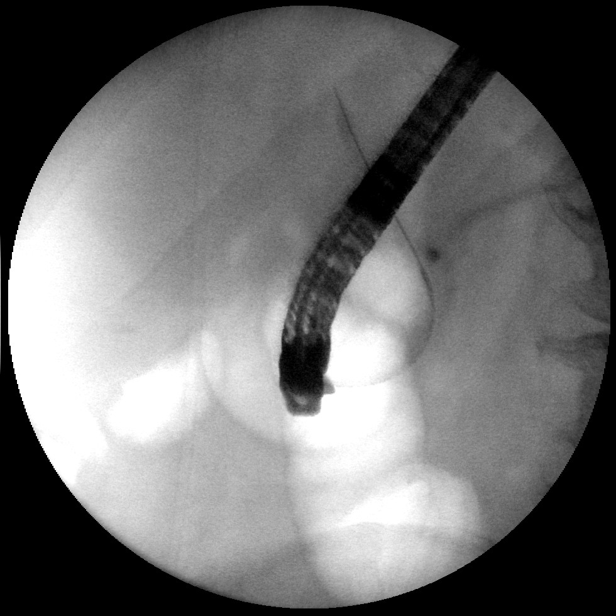
[im 1/8]
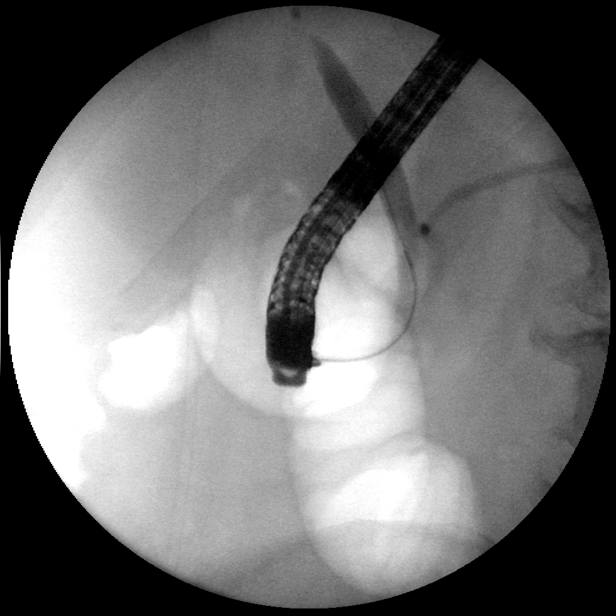
[im 2/8]
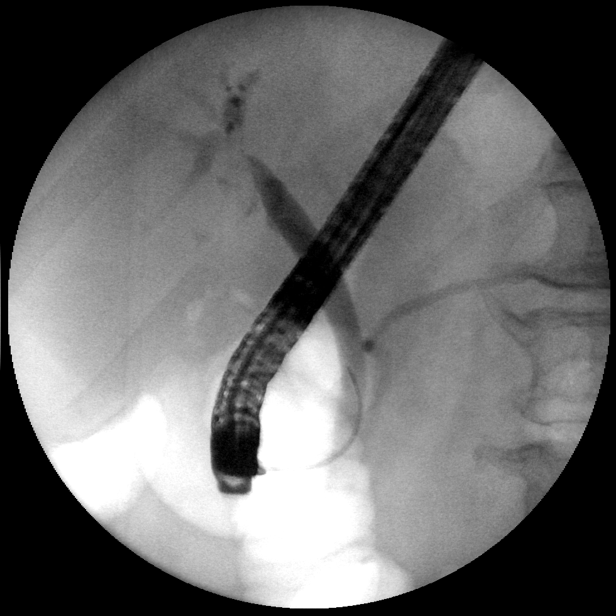
[im 2/8]
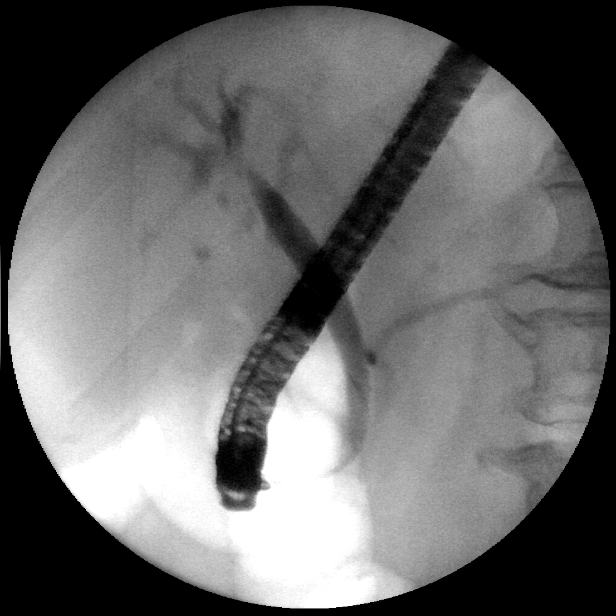
[im 2/8]
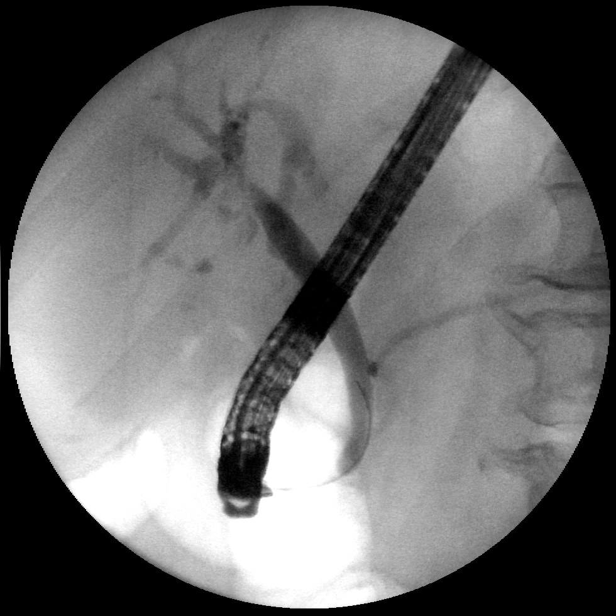
[im 4/8]
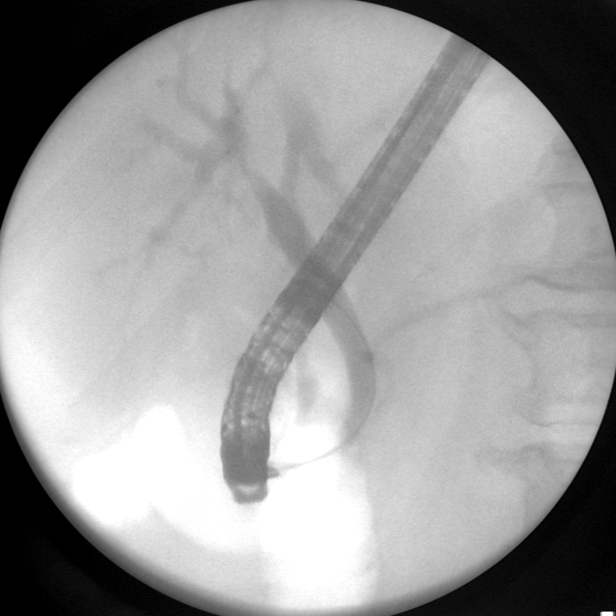
[im 5/8]
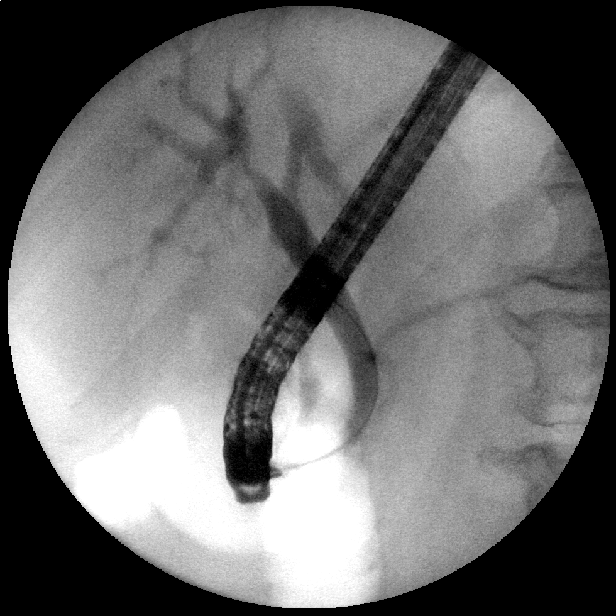
[im 5/8]
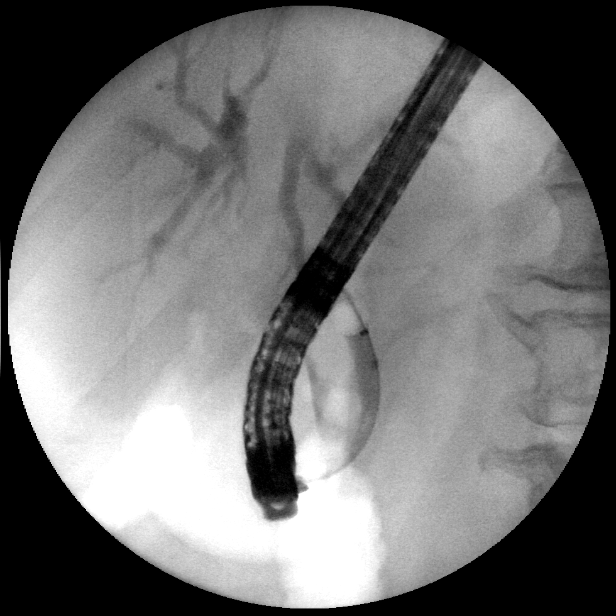
[im 5/8]
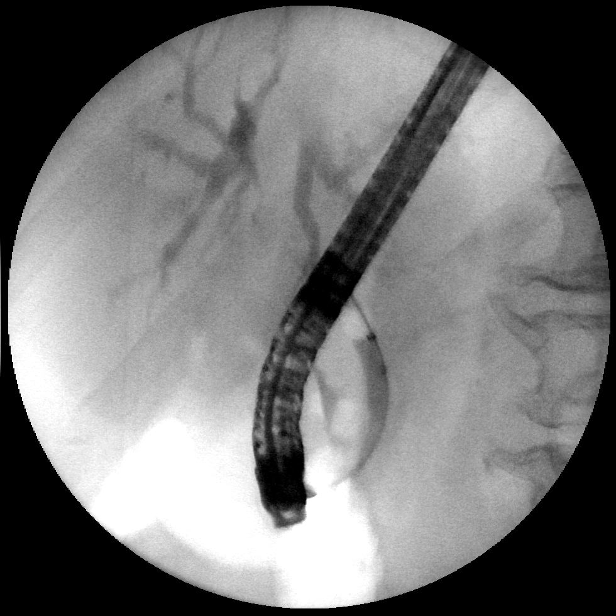
[im 6/8]
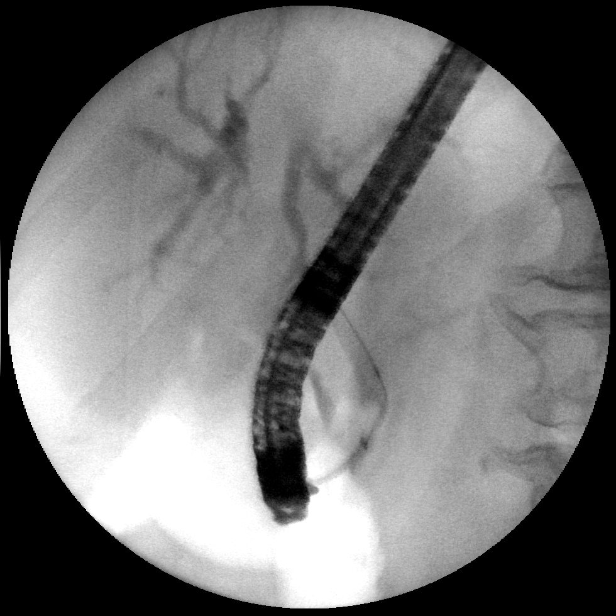
[im 6/8]
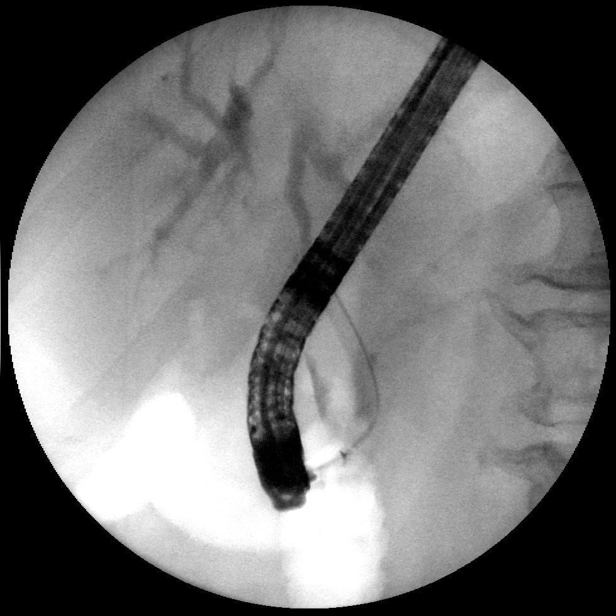
[im 6/8]
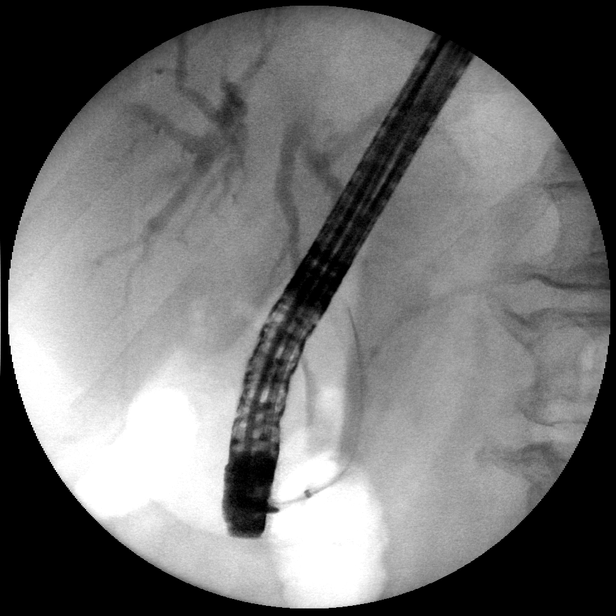
[im 7/8]
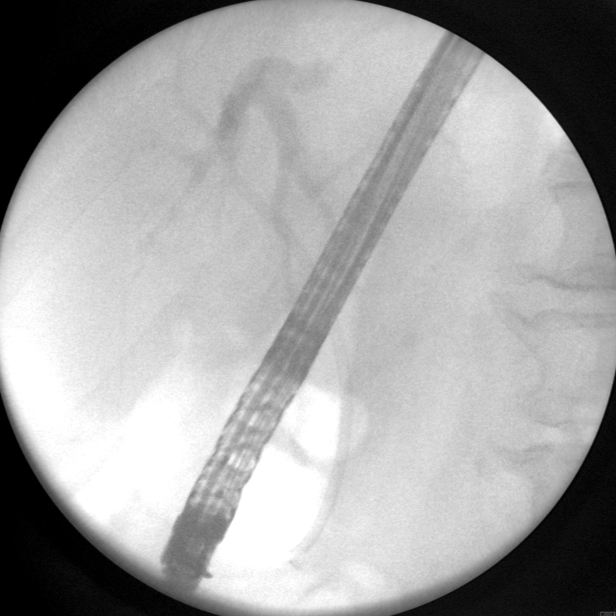
[im 8/8]
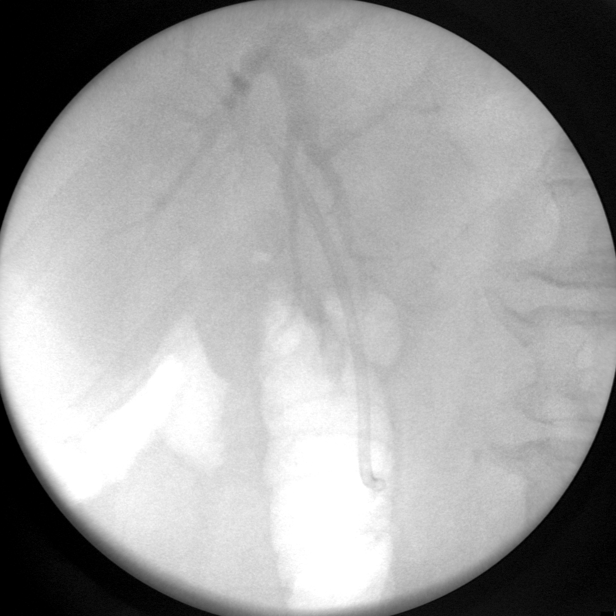

[15 of 19 positions shown; findings below may reference images not displayed]

FINDINGS: Initial injection into the common duct and pancreatic
duct demonstrates normal pancreatic duct caliber.  The common duct
is upper normal.  No evidence of choledocholithiasis.  There is a
persistent area of narrowing in the porta hepatis region with mild
to moderate intrahepatic biliary ductal dilatation.  Example image
130 of series one.  Subsequent images demonstrate balloon pull-
through within the common duct.  The final image demonstrates
placement of a common duct stent.
IMPRESSION: Biliary narrowing in the region of the porta hepatis.
Considerations include cholangiocarcinoma or Kristin syndrome.
Case was discussed with Dr. .  MRCP is pending.

These images were submitted for radiologic interpretation only.
Please see the procedural report for the amount of contrast and the
fluoroscopy time utilized.

## 2014-02-28 IMAGING — CR DG CHEST 2V
3 series · 3 of 3 positions shown · non-contrast
Comparison: 07/28/2012

CLINICAL DATA: Fever, cough, possible pneumonia

CHEST - 2 VIEW

[view not recorded (1 of 3)]
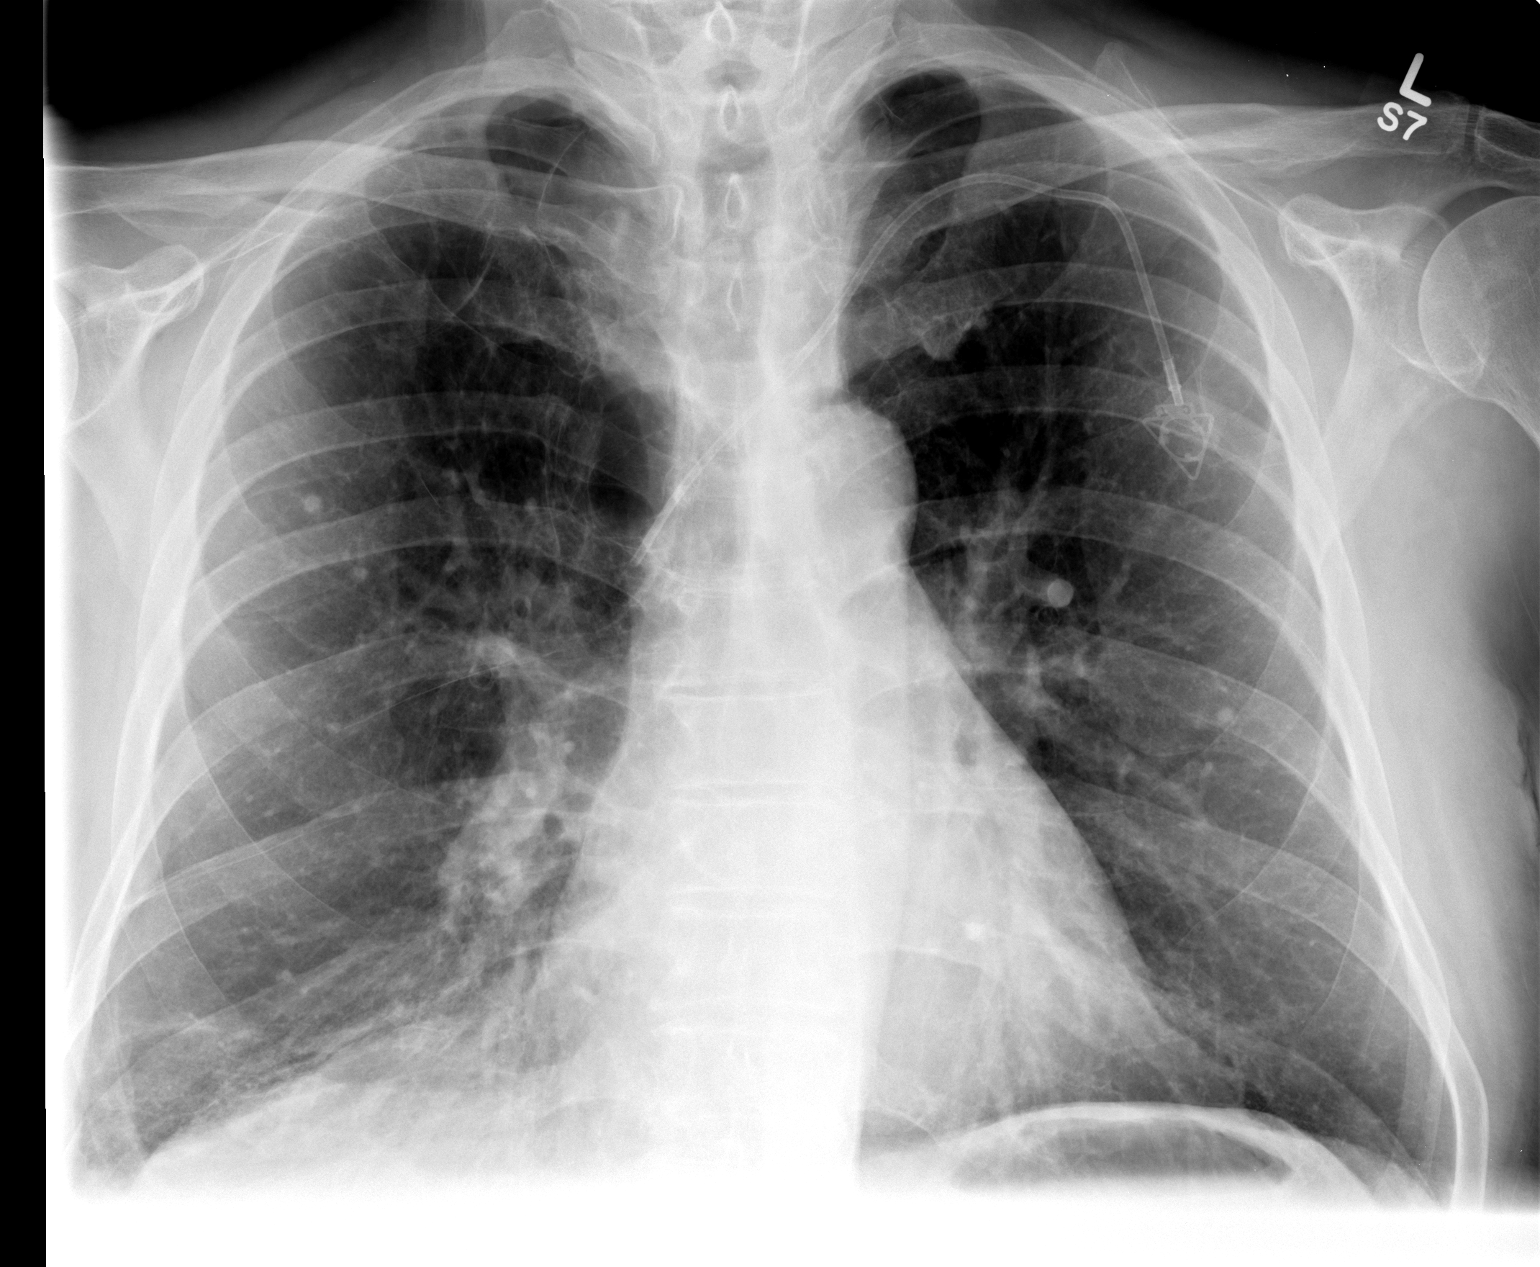

[view not recorded (2 of 3)]
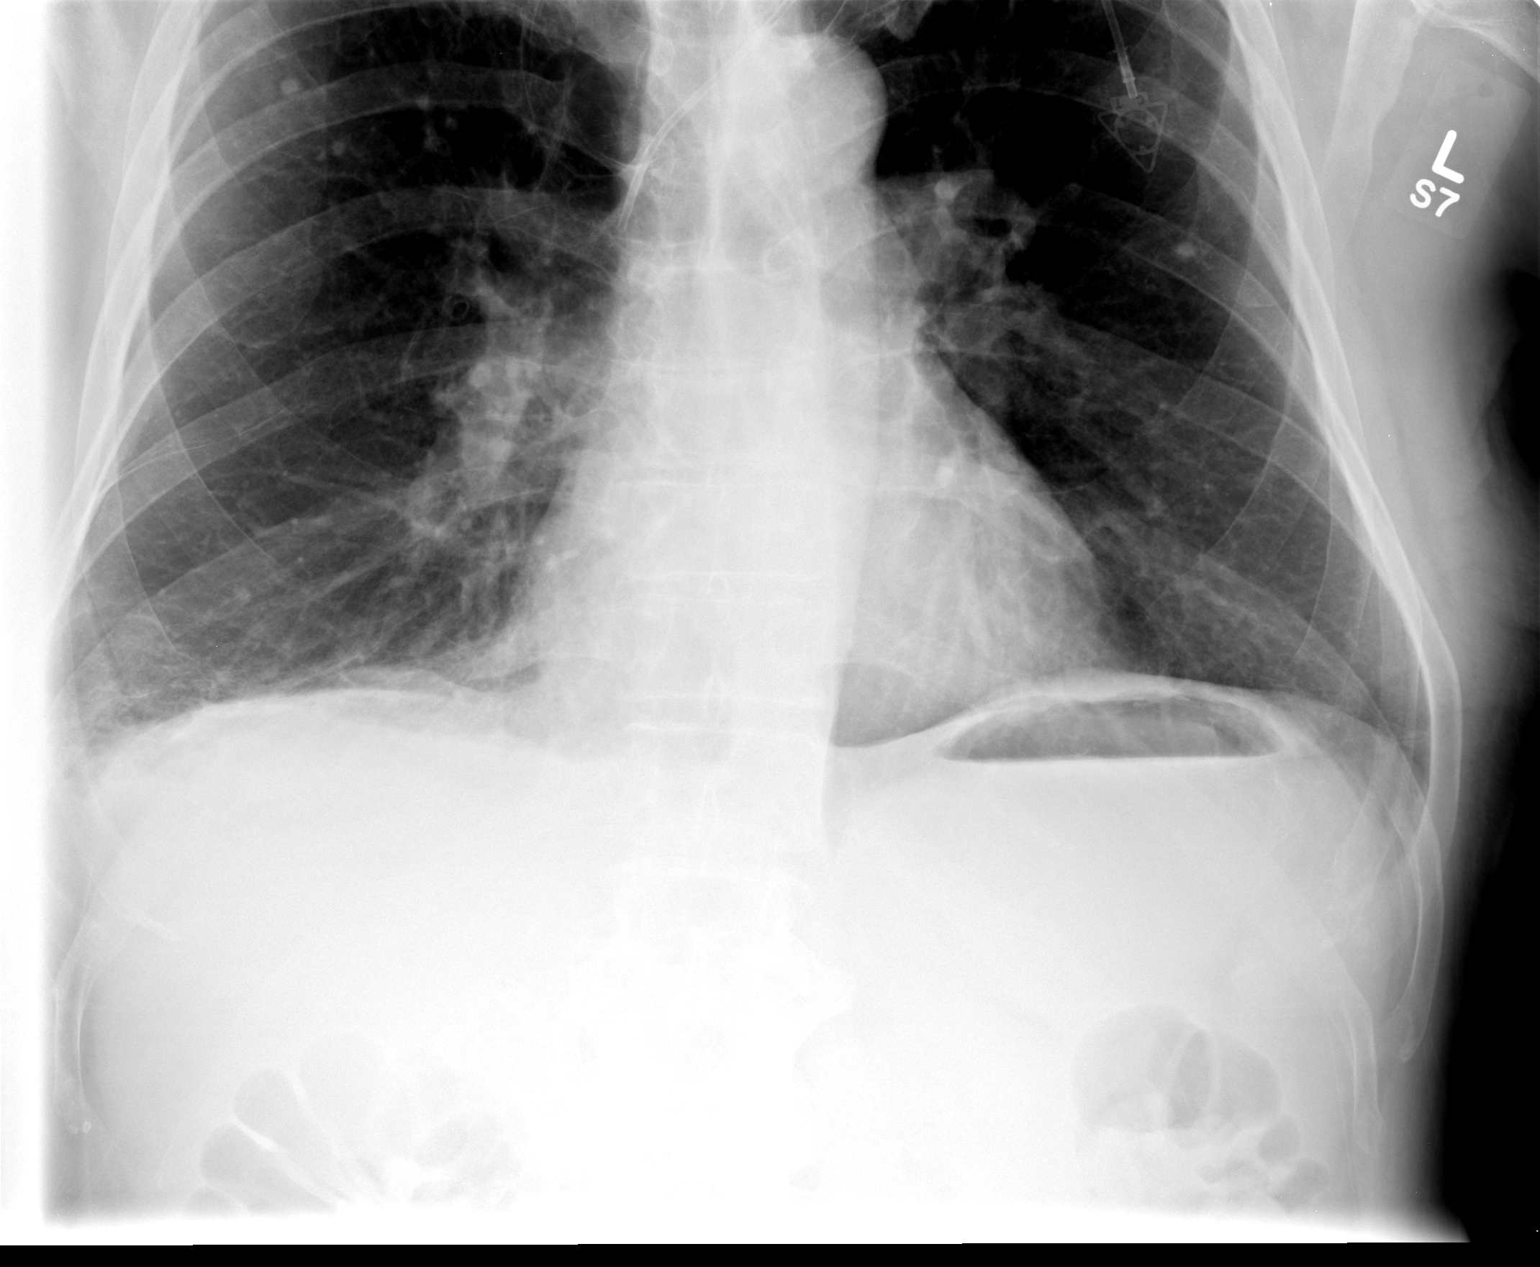

[view not recorded (3 of 3)]
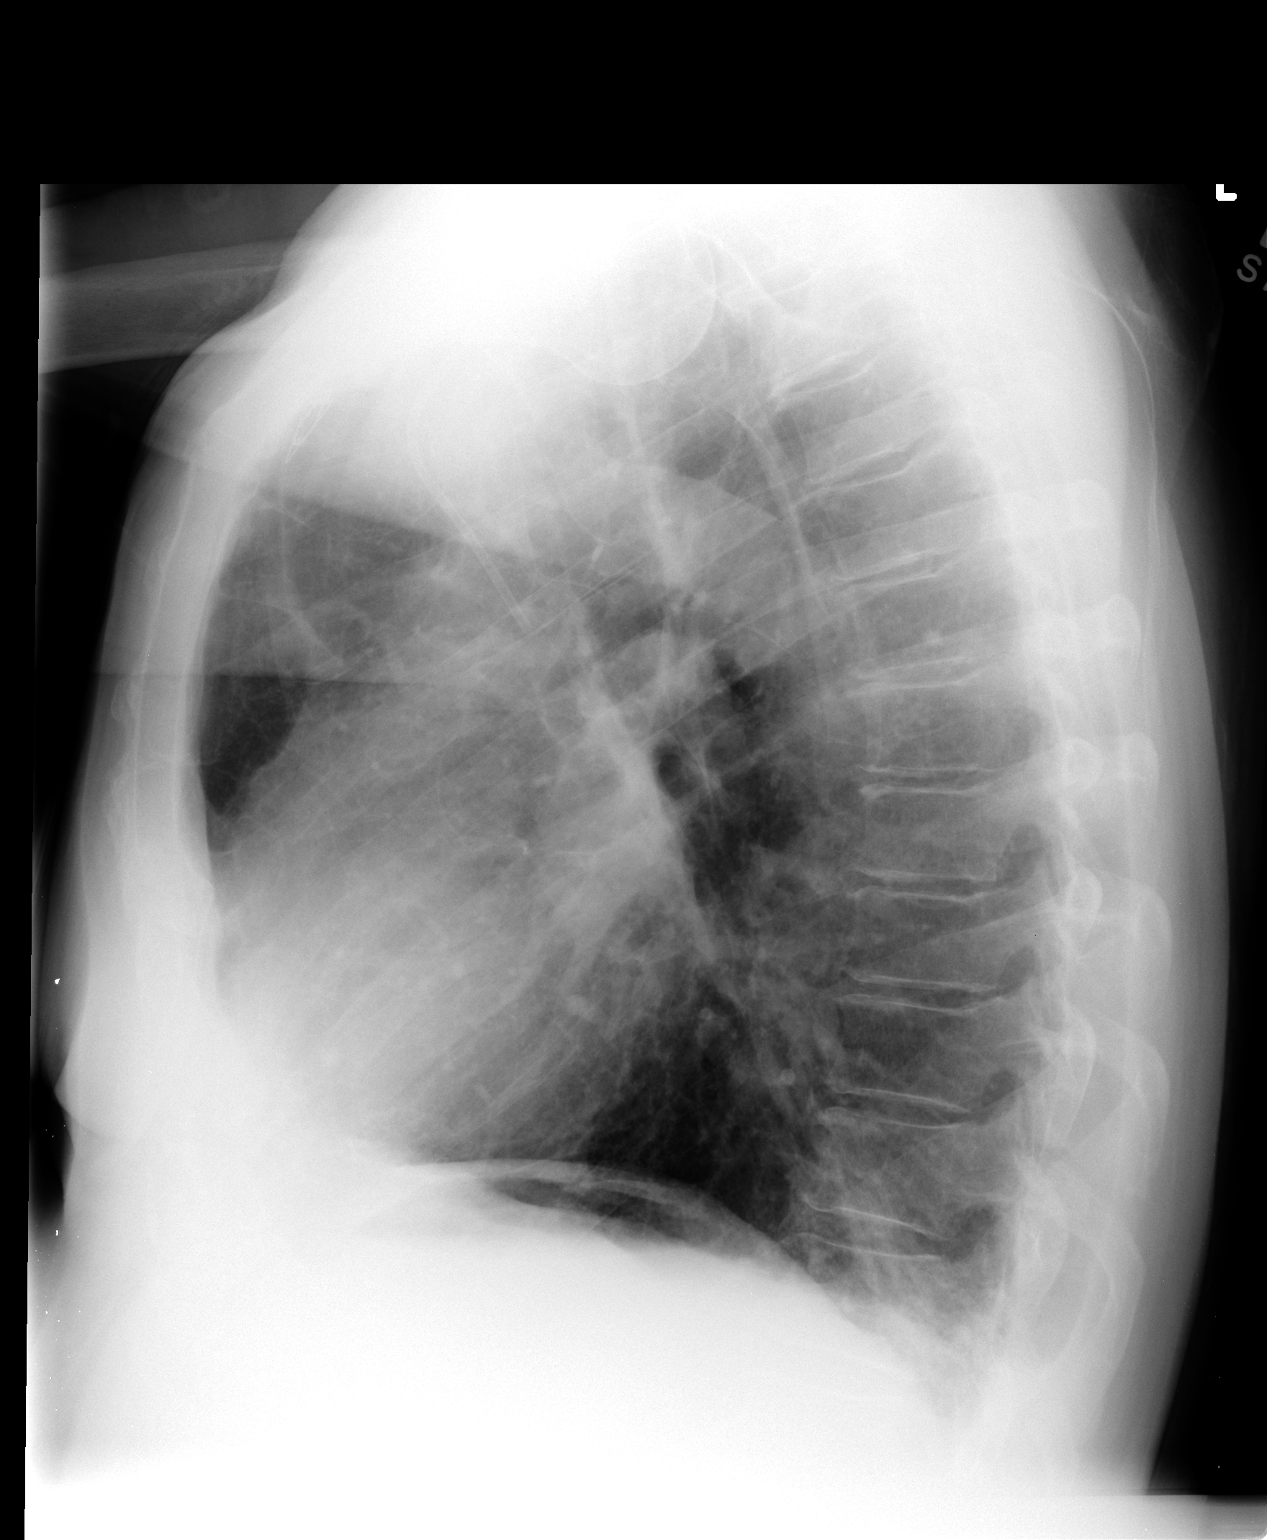

[3 of 3 positions shown; findings below may reference images not displayed]

FINDINGS: Cardiomediastinal silhouette is stable.  Left subclavian
Port-A-Cath is unchanged in position.  There is no pulmonary edema.
Calcified granulomas bilaterally again noted.  There is streaky
left basilar atelectasis or infiltrate.  Question small left
pleural effusion.
IMPRESSION: No pulmonary edema.  Streaky left basilar atelectasis or early
infiltrate.  Probable small left pleural effusion.

## 2014-04-24 IMAGING — CR DG CHEST 1V
1 series · 1 of 1 positions shown · non-contrast
Comparison: 08/11/2012

CLINICAL DATA: Pleural effusions

CHEST - 1 VIEW

[view not recorded]
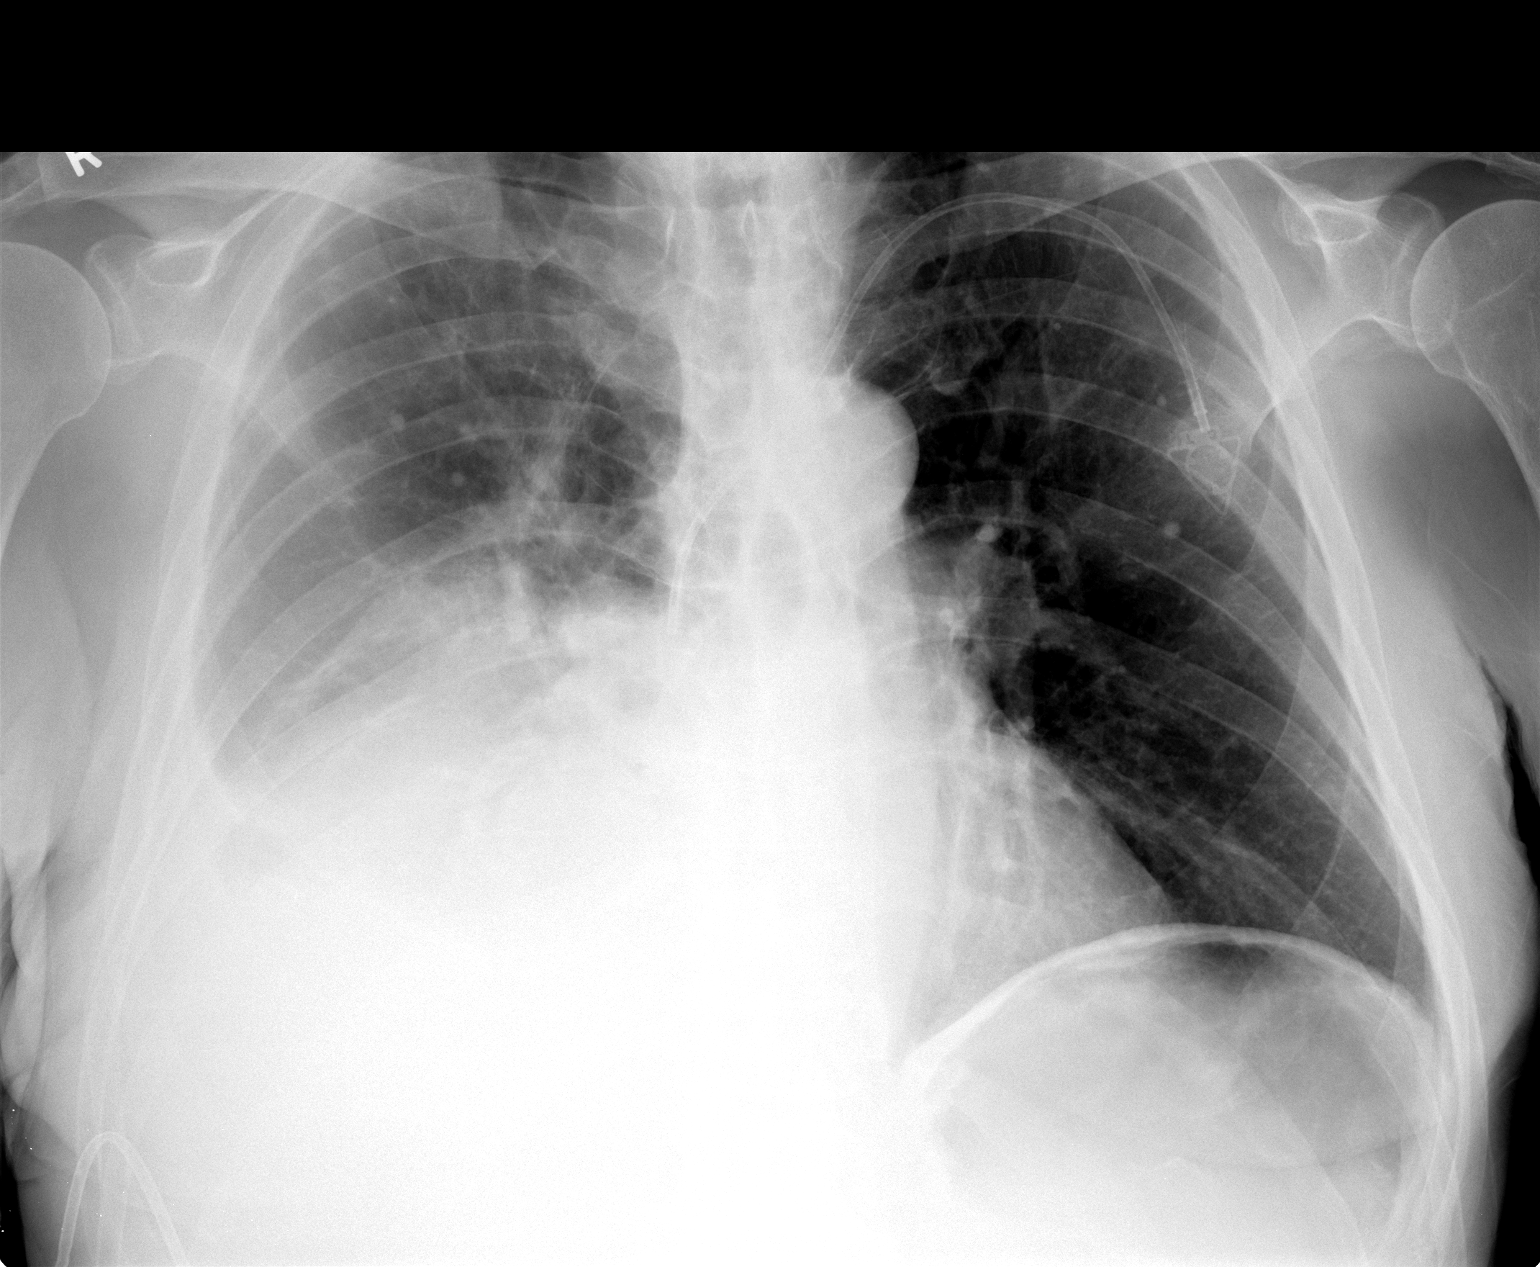

[1 of 1 positions shown; findings below may reference images not displayed]

FINDINGS: Cardiomediastinal silhouette is stable.  Left subclavian
Port-A-Cath with tip in SVC.  There is small right pleural effusion
with right basilar consolidation atelectasis or infiltrate.  Again
noted small calcified granulomas bilaterally. Left lung is clear.
IMPRESSION: No pulmonary edema.  Small right pleural effusion with right
basilar consolidation, atelectasis or infiltrate.  Stable bilateral
small calcified granulomas.
